# Patient Record
Sex: Male | Born: 1935 | Race: White | Hispanic: No | Marital: Married | Smoking: Never smoker
Health system: Southern US, Community
[De-identification: ages and names within clinical notes are randomized; demographics above are authoritative.]

## PROBLEM LIST (undated history)

## (undated) DIAGNOSIS — E119 Type 2 diabetes mellitus without complications: Secondary | ICD-10-CM

## (undated) DIAGNOSIS — G20A1 Parkinson's disease without dyskinesia, without mention of fluctuations: Secondary | ICD-10-CM

## (undated) DIAGNOSIS — F0280 Dementia in other diseases classified elsewhere without behavioral disturbance: Secondary | ICD-10-CM

## (undated) DIAGNOSIS — L570 Actinic keratosis: Secondary | ICD-10-CM

## (undated) HISTORY — DX: Actinic keratosis: L57.0

---

## 2004-12-17 ENCOUNTER — Ambulatory Visit: Payer: Self-pay | Admitting: Ophthalmology

## 2005-09-02 ENCOUNTER — Ambulatory Visit: Payer: Self-pay | Admitting: Ophthalmology

## 2005-09-09 ENCOUNTER — Ambulatory Visit: Payer: Self-pay | Admitting: Ophthalmology

## 2005-12-15 ENCOUNTER — Ambulatory Visit: Payer: Self-pay | Admitting: Gastroenterology

## 2006-09-22 ENCOUNTER — Ambulatory Visit: Payer: Self-pay | Admitting: Unknown Physician Specialty

## 2007-02-20 ENCOUNTER — Emergency Department: Payer: Self-pay | Admitting: Emergency Medicine

## 2007-09-22 DIAGNOSIS — C4492 Squamous cell carcinoma of skin, unspecified: Secondary | ICD-10-CM

## 2007-09-22 HISTORY — DX: Squamous cell carcinoma of skin, unspecified: C44.92

## 2009-02-13 ENCOUNTER — Ambulatory Visit: Payer: Self-pay | Admitting: Internal Medicine

## 2009-12-13 ENCOUNTER — Emergency Department: Payer: Self-pay | Admitting: Emergency Medicine

## 2011-12-04 ENCOUNTER — Emergency Department: Payer: Self-pay | Admitting: Internal Medicine

## 2011-12-04 LAB — COMPREHENSIVE METABOLIC PANEL
Albumin: 4 g/dL (ref 3.4–5.0)
Alkaline Phosphatase: 73 U/L (ref 50–136)
Anion Gap: 10 (ref 7–16)
Calcium, Total: 9.1 mg/dL (ref 8.5–10.1)
Creatinine: 0.95 mg/dL (ref 0.60–1.30)
EGFR (Non-African Amer.): >60
Glucose: 188 mg/dL — ABNORMAL HIGH (ref 65–99)
Sodium: 143 mmol/L (ref 136–145)
Total Protein: 7.4 g/dL (ref 6.4–8.2)

## 2011-12-04 LAB — CBC
HGB: 15.2 g/dL (ref 13.0–18.0)
MCV: 96 fL (ref 80–100)
Platelet: 148 10*3/uL — ABNORMAL LOW (ref 150–440)
RBC: 4.67 10*6/uL (ref 4.40–5.90)
WBC: 5.4 10*3/uL (ref 3.8–10.6)

## 2011-12-04 LAB — TROPONIN I: Troponin-I: <0.02 ng/mL

## 2011-12-30 ENCOUNTER — Ambulatory Visit: Payer: Self-pay | Admitting: Internal Medicine

## 2012-01-11 ENCOUNTER — Ambulatory Visit: Payer: Self-pay | Admitting: Internal Medicine

## 2012-03-07 ENCOUNTER — Ambulatory Visit: Payer: Self-pay | Admitting: Internal Medicine

## 2012-03-12 ENCOUNTER — Ambulatory Visit: Payer: Self-pay | Admitting: Internal Medicine

## 2012-04-11 ENCOUNTER — Ambulatory Visit: Payer: Self-pay | Admitting: Internal Medicine

## 2013-06-07 ENCOUNTER — Observation Stay: Payer: Self-pay | Admitting: Internal Medicine

## 2013-06-07 LAB — CBC
HCT: 42.2 % (ref 40.0–52.0)
HGB: 15.2 g/dL (ref 13.0–18.0)
MCV: 91 fL (ref 80–100)
RDW: 13.4 % (ref 11.5–14.5)
WBC: 9.4 10*3/uL (ref 3.8–10.6)

## 2013-06-07 LAB — COMPREHENSIVE METABOLIC PANEL
Albumin: 3.9 g/dL (ref 3.4–5.0)
Alkaline Phosphatase: 79 U/L (ref 50–136)
Anion Gap: 12 (ref 7–16)
Bilirubin,Total: 3.4 mg/dL — ABNORMAL HIGH (ref 0.2–1.0)
Chloride: 103 mmol/L (ref 98–107)
Co2: 21 mmol/L (ref 21–32)
Creatinine: 1.09 mg/dL (ref 0.60–1.30)
EGFR (African American): >60
Glucose: 126 mg/dL — ABNORMAL HIGH (ref 65–99)
Osmolality: 277 (ref 275–301)
SGOT(AST): 26 U/L (ref 15–37)
SGPT (ALT): 31 U/L (ref 12–78)

## 2013-06-07 LAB — LIPASE, BLOOD: Lipase: 227 U/L (ref 73–393)

## 2013-06-08 LAB — BASIC METABOLIC PANEL
BUN: 17 mg/dL (ref 7–18)
Chloride: 108 mmol/L — ABNORMAL HIGH (ref 98–107)
Creatinine: 0.81 mg/dL (ref 0.60–1.30)
EGFR (African American): >60
Glucose: 102 mg/dL — ABNORMAL HIGH (ref 65–99)
Osmolality: 279 (ref 275–301)
Potassium: 3.9 mmol/L (ref 3.5–5.1)

## 2013-06-08 LAB — CBC WITH DIFFERENTIAL/PLATELET
Basophil #: 0 10*3/uL (ref 0.0–0.1)
Basophil %: 0.1 %
Eosinophil #: 0 10*3/uL (ref 0.0–0.7)
HGB: 13.4 g/dL (ref 13.0–18.0)
Lymphocyte %: 17.8 %
MCH: 32 pg (ref 26.0–34.0)
MCHC: 35.6 g/dL (ref 32.0–36.0)
Monocyte #: 1.2 x10 3/mm — ABNORMAL HIGH (ref 0.2–1.0)
Neutrophil #: 6 10*3/uL (ref 1.4–6.5)
RDW: 13.7 % (ref 11.5–14.5)
WBC: 8.8 10*3/uL (ref 3.8–10.6)

## 2013-06-09 LAB — BASIC METABOLIC PANEL
Anion Gap: 7 (ref 7–16)
BUN: 15 mg/dL (ref 7–18)
Co2: 23 mmol/L (ref 21–32)
EGFR (Non-African Amer.): >60
Glucose: 86 mg/dL (ref 65–99)
Potassium: 3.5 mmol/L (ref 3.5–5.1)

## 2013-06-09 LAB — CBC WITH DIFFERENTIAL/PLATELET
Basophil #: 0 10*3/uL (ref 0.0–0.1)
Basophil %: 0.2 %
Eosinophil #: 0.1 10*3/uL (ref 0.0–0.7)
Eosinophil %: 0.7 %
HCT: 34.4 % — ABNORMAL LOW (ref 40.0–52.0)
HGB: 12.3 g/dL — ABNORMAL LOW (ref 13.0–18.0)
MCH: 32.5 pg (ref 26.0–34.0)
MCHC: 35.9 g/dL (ref 32.0–36.0)
Monocyte #: 1.2 x10 3/mm — ABNORMAL HIGH (ref 0.2–1.0)
Neutrophil #: 5 10*3/uL (ref 1.4–6.5)
Neutrophil %: 60.9 %
RDW: 13.2 % (ref 11.5–14.5)

## 2013-06-09 LAB — BILIRUBIN, TOTAL: Bilirubin,Total: 2.8 mg/dL — ABNORMAL HIGH (ref 0.2–1.0)

## 2013-06-09 LAB — BILIRUBIN, DIRECT: Bilirubin, Direct: 0.3 mg/dL — ABNORMAL HIGH (ref 0.00–0.20)

## 2013-06-13 LAB — PATHOLOGY REPORT

## 2013-09-18 ENCOUNTER — Ambulatory Visit: Payer: Self-pay | Admitting: Unknown Physician Specialty

## 2013-12-13 DIAGNOSIS — C4491 Basal cell carcinoma of skin, unspecified: Secondary | ICD-10-CM

## 2013-12-13 HISTORY — DX: Basal cell carcinoma of skin, unspecified: C44.91

## 2015-02-01 NOTE — H&P (Signed)
PATIENT NAME:  Brandon, Barrett MR#:  [id number] DATE OF BIRTH:  November 05, 1935  DATE OF ADMISSION:  06/07/2013  REFERRING PHYSICIAN:  Dr. Benjaman Lobe.   PRIMARY CARE PHYSICIAN: Dr. Ginette Pitman.  CHIEF COMPLAINT: Hiccups, p.o. intolerance. Came in with complaints of shortness of breath.   HISTORY OF PRESENT ILLNESS: The patient is a pleasant 79 year old Caucasian male with history of hyperlipidemia, hypertension, who recently was getting treated by Dr. Tami Ribas as an outpatient for right-sided neck infection, which he states is severe. He initially had throat pain and had gone to urgent care center where he was given amoxicillin, but he did not get better and went to Dr. Tami Ribas apparently who has seen him 3 times. He was placed on prednisone and another antibiotic, which he thinks is clindamycin for 2 weeks. He last took it on Wednesday. Since then, he has had increased drainage from the back of his throat and he has constant nausea. He has no fevers. He constantly feels like he is going to vomit. He has poor p.o. intake and the last couple of days has  had intractable hiccups.  He feels short of breath after the hiccups as he feels like he cannot get enough air. He came into the hospital where a CAT scan of the neck with contrast was done which shows the no acute abnormality of the nasopharynx or oropharynx. No parapharyngeal or retropharyngeal abscess is demonstrated, but there is some mild thickening of the upper thoracic esophagus. He has lost 10 pounds since this whole ordeal and hospitalist services were contacted for further evaluation and management.   PAST MEDICAL HISTORY: Hyperlipidemia, hypertension, diabetes, recent throat infection.   PAST SURGICAL HISTORY:  History of perianal abscess drainage, deviated septum surgery, cataract surgery. He has had skin lesions removed which were cancerous. 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   SOCIAL HISTORY: No tobacco.  Occasional alcohol. No illicit drugs. He lives with his wife.   ALLERGIES: No known drug allergies.   OUTPATIENT MEDICATIONS: Aspirin 325 mg daily, Lipitor 40 mg daily, lisinopril 5 mg daily, metformin 500 mg daily.   REVIEW OF SYSTEMS: CONSTITUTIONAL: Positive for 10 pounds weight loss in the last few weeks, weakness. No fevers.  EYES: No blurry vision or double vision.  ENT: Has hearing loss and he has a hearing aid. He has some postnasal drip and has pooling of secretions, in his mouth. He has intractable hiccups for the last couple of days.  RESPIRATORY: No cough, no dyspnea on exertion. No COPD.  No wheezing. No painful respirations. Feels short of breath.  CARDIOVASCULAR: No chest pain, orthopnea or swelling in the legs. No arrhythmia.  GASTROINTESTINAL: Positive for nausea hiccups. No abdominal pain. No hematemesis, bloody stools or dark stools.  GENITOURINARY: Denies dysuria or hematuria.  HEMATOLOGIC AND LYMPHATIC: No anemia or easy bruising.  SKIN: No rashes.  MUSCULOSKELETAL: Denies arthritis or gout.  NEUROLOGIC: Denies focal weakness or numbness.  PSYCHIATRIC: Denies anxiety or insomnia.   PHYSICAL EXAMINATION: VITAL SIGNS: Temperature on arrival was noted to be 97.9, pulse rate was 91, respiratory rate initially was 28, last respiratory rate of 20. Initial blood pressure was 117/68. O2 sat initially was 87% on room air. He is currently on oxygen satting 100%.  GENERAL: The patient is Caucasian male lying in bed in no obvious distress, but with intractable hiccups. He is hiccuping multiple times as I am examining him.  HEENT: Normocephalic, atraumatic. Pupils are equal  and reactive. Anicteric sclerae. Extraocular muscles intact. Moist mucous membranes. There no tonsillar exudate or enlargement on physical examination.   NECK: Supple. No thyroid tenderness. No cervical lymphadenopathy.  CARDIOVASCULAR: S1, S2 regular rate and rhythm. No murmurs or rubs or gallops.  LUNGS: Clear  to auscultation. No wheezing, rhonchi or rales.  ABDOMEN: Soft, nontender, nondistended. Positive bowel sounds in all quadrants. No guarding.  EXTREMITIES: No significant lower extremity edema.  NEUROLOGIC: Cranial nerves II through XII grossly intact. Strength is 5 out of 5 in all extremities. Sensation intact to light touch.  PSYCHIATRIC: Awake, alert, oriented x3.   LABORATORY AND IMAGING:  CAT scan of neck with contrast as above. Furthermore, there is no evidence of cord or cervical lymphadenopathy. The pulmonary apices are clear. WBC 9.4, hemoglobin 15.2. Troponin is negative, platelets are 173. LFTs showed bilirubin of 3.4, otherwise within normal limits. BUN 23, creatinine is 1.09, glucose 126, sodium 136, potassium 3.6. EKG showed sinus rhythm with premature atrial complex, rate is 71. Nonspecific ST changes or.   ASSESSMENT AND PLAN: We have a 79 year old male with hypertension, diabetes,  hyperlipidemia with recent infection who finished prednisone and clindamycin a few days ago, presents with poor p.o. intake secondary to pooling of secretions in the back of his throat,  persistent nausea and the last couple of days hiccups. He has lost 10 pounds and has poor p.o. intake and p.o. intolerance. At this point, we will admit him to go to the hospital for observation. The etiology of this is unclear. The CAT scan of the neck shows no significant abscess or lymphadenopathy. It does show esophageal thickening in the thoracic region. The source of this is unclear. I have paged Dr. Tami Ribas to discuss the recent infection with him, but I doubt that he has acute infection. He has no fever or leukocytosis. We will obtain a Gastroenterology consult, start him on Zofran and Thorazine for symptomatic relief and some gentle fluids. Would start him on clears for now and see what Gastroenterology has to say. He might need an upper endoscopy for further evaluation. Although he has tachypnea initially and hypoxia, I  doubt he has acute respiratory failure. He has persistent hiccups and post hiccups he becomes, kind of tachypneic as he has to catch his breath. Nonetheless, I would further investigate this with x-ray of the chest and an ABG. He does not appear to be short of breath to me nor does he appear to be labored. I would hold his metformin, start him on sliding scale insulin, hold his blood pressure medications, start him on some gentle fluids and start him on deep vein thrombosis prophylaxis as well. He has received a dose of the Decadron as well as low-dose Ativan. We will see if the Thorazine p.o. will help his hiccups. I will see what the x-ray of the chest shows as well.   Total Time Spent: 55 minutes.   CODE STATUS: THE PATIENT IS FULL CODE.     ____________________________ Vivien Presto, MD sa:dp D: 06/07/2013 13:36:06 ET T: 06/07/2013 14:14:35 ET JOB#: [id number]  cc: Vivien Presto, MD, <Dictator> Tracie Harrier, MD Vivien Presto MD ELECTRONICALLY SIGNED 07/04/2013 13:47

## 2015-02-01 NOTE — Consult Note (Signed)
Details:   - EGD done today for wt loss, hiccups, anorexia.   Essentially unremarkable except for what appears to be atrophic gastritis.   Biospies taken for h.pylori. Esophagus also biopsied at GE junction and prox esophagus.   No etiology seen for the symptoms.  Would obtain CT c/a/p given the ongoing symtpoms with no clear etiology.   Electronic Signatures: Arther Dames (MD)  (Signed 28-Aug-14 15:51)  Authored: Details   Last Updated: 28-Aug-14 15:51 by Arther Dames (MD)

## 2015-02-01 NOTE — Op Note (Signed)
PATIENT NAME:  Brandon Barrett, Brandon Barrett MR#:  [id number] DATE OF BIRTH:  March 25, 1936  DATE OF PROCEDURE:  06/07/2013  PREOPERATIVE DIAGNOSIS: Dysphagia, difficulty breathing.   POSTOPERATIVE DIAGNOSIS: Dysphagia, difficulty breathing.   PROCEDURE: Flexible fiber-optic nasal laryngoscopy.   SURGEON: Malon Kindle, MD.   INDICATIONS: The patient is with complaints of dysphagia and some shortness of breath this morning. He is not having any significant hoarseness and there is certainly no stridor.   FINDINGS: Hypopharynx, larynx and tongue base were carefully inspected. Vocal cords are clear and mobile. There may be some mild pachyderma of the posterior glottis but no significant swelling or erythema is seen. There is no pooling of secretions or any mass lesions in the hypopharynx. No mucosal abnormalities are seen other than the minor pachyderma. There is certainly no evidence of infection such as thrush or bacterial infection.   DESCRIPTION OF PROCEDURE: After discussing the procedure with the patient, the left nasal cavity was assessed endoscopically with a flexible fiber-optic scope. The scope was passed into the nasopharynx and down to the region of the hypopharynx, larynx and tongue base. These areas were carefully inspected. He was instructed to phonate to assess vocal cord motion which was normal. Tongue was extended to further evaluate the tongue base. Findings are as noted above. The scope was withdrawn. The patient tolerated the procedure well.    ____________________________ Sammuel Hines. Richardson Landry, MD psb:np D: 06/07/2013 13:51:59 ET T: 06/07/2013 18:25:15 ET JOB#: [id number]  cc: Sammuel Hines. Richardson Landry, MD, <Dictator> Riley Nearing MD ELECTRONICALLY SIGNED 06/14/2013 17:33

## 2015-02-01 NOTE — Consult Note (Signed)
PATIENT NAME:  Brandon Barrett, Brandon Barrett MR#:  [id number] DATE OF BIRTH:  04-Jan-1936  DATE OF CONSULTATION:  06/07/2013  REQUESTING PHYSICIAN: Ferman Hamming, MD.   CONSULTING PHYSICIAN: Sammuel Hines. Richardson Landry, M.D.   REASON FOR CONSULTATION: Swallowing and breathing difficulty.   HISTORY OF PRESENT ILLNESS: This is a 79 year old male who has been seen by Dr. Tami Ribas recently with stomatitis which significantly improved with antibiotics and prednisone. He had some hoarseness initially but that has all gotten better. Dr. Tami Ribas, just saw him 2 days ago and had referred him into Gastroenterology because of persistent issues with getting nauseated. He has had some anorexia with this and says his food just does not taste good. He is mostly drinking liquids. He gets nauseated if he tries to eat. He also has a cough that aggravates the nausea. He was seen by Gastroenterology yesterday and apparently they set up an upper GI endoscopy for next week. Today he felt like he was short of breath with a lot of phlegm in his throat and some increasing difficulty swallowing. Because of the shortness of breath. He came to the Emergency Room. He feels like he may have some mucus drainage down the back of his throat contributing to some of this. He feels like the right side of the throat is worse. He is not running any fever. He had difficulty sleeping last night due to the feeling of having problems breathing and difficulty swallowing.   PAST MEDICAL HISTORY: Diabetes, hypertension, hyperlipidemia.   ALLERGIES: None.   MEDICATIONS: Metformin 500 mg daily, lisinopril 5 mg p.o. daily, Lipitor 40 mg p.o. daily. Aspirin 325 mg daily.   SOCIAL HISTORY: The patient is a nonsmoker and does not drink alcohol.   REVIEW OF SYSTEMS: As above.   PHYSICAL EXAMINATION: VITAL SIGNS: Temperature 97.9, pulse 69 blood pressure 131/75.  GENERAL: Well-developed, well-nourished male in no acute distress. There is no evidence of stridor. He  has no appreciable hoarseness.  HEAD AND FACE: Head is normocephalic, atraumatic. No facial skin lesions. Facial strength is normal and symmetric.  EARS:  He wears hearing aids. External ears are unremarkable. Ear canals: Tympanic membranes are clear bilaterally.  NOSE: External nose unremarkable. Nasal cavity is clear. No purulence or polyps are seen. There is some minimal rightward septal deviation.  ORAL CAVITY AND OROPHARYNX: Teeth, lips, and gums unremarkable. Tongue and floor of mouth without lesions. Posterior pharynx is clear with no erythema or exudate. There is no evidence of any ulceration or frank thrush.  NECK: Neck is supple without adenopathy or mass. There is no thyromegaly. Salivary glands are soft and nontender without masses.  NEUROLOGIC EXAM: Cranial nerves II through XII are grossly intact.   Flexible fiber-optic nasal laryngoscopy was performed today because of shortness of breath and dysphagia. The hypopharynx, larynx and tongue base are essentially unremarkable. There may be some mild pachyderma of the posterior glottis seen with laryngopharyngeal reflux but nothing impressive. There is no significant erythema there. There is no pooling of secretions and certainly no evidence of any infection.   DATA REVIEW: His white count is normal at 9.4. Total bilirubin was slightly elevated and his BUN is slightly elevated, has a normal creatinine. A CT scan of the neck was performed and there was some mild thickening seen in the wall of the upper thoracic esophagus in areas there that were visualized. No cervical adenopathy was seen.   ASSESSMENT:  Mr. Wehmeyer has no evidence of any compromise of his upper airway. In addition  the hypopharynx, larynx and tongue base are essentially unremarkable except for some suggestion of mild reflux. Certainly nothing that would impair his swallowing significantly. The CT scan, however, indicated possible esophageal pathology.   PLAN: I would defer to  GI at this point for further evaluation for possible esophageal pathology contributing to this. Obviously initiation of reflux management might be a temporizing measure if upper GI endoscopy could not be performed until next week, so Prilosec 40 mg prior to his evening meal was suggested. Tessalon Perles may be considered to help suppress some of his cough reflex. We will defer to the Emergency Room to do any further evaluation deemed necessary to assess his shortness of breath although his sats were fine in the Emergency Room.     ____________________________ Sammuel Hines. Richardson Landry, MD psb:dp D: 06/07/2013 13:50:25 ET T: 06/07/2013 14:42:29 ET JOB#: [id number]  cc: Sammuel Hines. Richardson Landry, MD, <Dictator> Riley Nearing MD ELECTRONICALLY SIGNED 06/14/2013 17:33

## 2015-02-01 NOTE — Discharge Summary (Signed)
PATIENT NAME:  Brandon Barrett, Brandon Barrett MR#:  [id number] DATE OF BIRTH:  [birth year]  DATE OF ADMISSION:  06/07/2013 DATE OF DISCHARGE:  06/10/2013  DISCHARGE DIAGNOSIS: Intractable hiccups associated with nausea and difficulty  swallowing with shortness of breath.   CHIEF COMPLAINT: Hiccups, shortness of breath, difficulty swallowing.  HISTORY OF PRESENT ILLNESS:  Alford Gamero is a 79 year old gentleman with a history of hyperlipidemia, hypertension and type 2 diabetes who was recently treated for throat infection, was recently also seen by ENT and placed on prednisone and a second antibiotic, who presented to the ER complaining of increased drainage in the back of the throat associated with nausea. He did complain of pain in the back of her throat and right side of the neck associated with decreased p.o. intake, weight loss and intractable hiccups.   PAST SURGICAL HISTORY: Significant for perianal abscess drainage, deviated septum surgery and cataract surgery.   PAST MEDICAL HISTORY: Significant for hyperlipidemia, hypertension, type 2 diabetes and recent throat infection.   PHYSICAL EXAMINATION: VITAL SIGNS: Temperature was 97.9, pulse 91, respirations 28, blood pressure 117/78, O2 sat 87% on room air and with oxygen improved to 100%. HEENT: [place]/AT  NECK: Supple.  HEART: S1 and S2.  LUNGS: Clear to auscultation.  ABDOMEN: Soft and nontender.  EXTREMITIES: No edema.  NEUROLOGIC: Nonfocal.  LABORATORY AND DIAGNOSTICS: WBC 9.4, hemoglobin 15.2. Troponin was negative. Platelets 173. LFTs showed bilirubin of 3.4, BUN 23, creatinine 1.09, glucose 126, sodium 136, potassium 3.6.   EKG showed sinus rhythm with PACs.   CT scan of the neck with contrast did not show any acute abnormality of the nasopharynx or oropharynx. There was mild thickening of the wall of the upper thoracic esophagus. Trachea appeared to be normal. Pulmonary apices appeared to be normal.   CT of the chest and abdomen did  not show any acute pathology.   HOSPITAL COURSE: The patient was also seen by ENT specialist, Dr. Richardson Landry, and also seen by GI specialist, Dr. Rayann Heman, and underwent an EGD as well as a flexible fiber-optic nasal laryngoscopy. The hypopharynx, larynx and tongue base was carefully inspected. Vocal cords were clear and mobile. There was mild pachyderma of the posterior glottis but no significant swelling or erythema was seen. There was no pooling of secretions or any mass lesions in the hypopharynx. No evidence of thrush or bacterial infection. Upper GI endoscopy showed atrophic gastritis. This was biopsied. There was normal duodenum. The esophagus was normal. There was diffuse moderate inflammation characterized by granularity and atrophy in the gastric body. Biopsies were taken. The patient gradually improved from a symptom standpoint. Hiccups also resolved. His blood pressure medication was held, and he was discharged in stable condition on aspirin 325 mg a day, Lipitor 40 mg a day and chlorpromazine 25 mg t.i.d. p.r.n. The patient has been advised to follow with me, Dr. Ginette Pitman, in 1 to 2 weeks time and to call back if there are any questions or concerns.   TOTAL TIME SPENT IN DISCHARGING THE PATIENT: 30 minutes. ____________________________ Tracie Harrier, MD vh:sb D: 06/16/2013 13:39:42 ET T: 06/16/2013 15:18:41 ET JOB#: [id number]  cc: Tracie Harrier, MD, <Dictator> Tracie Harrier MD ELECTRONICALLY SIGNED 06/29/2013 18:26

## 2015-02-01 NOTE — Consult Note (Signed)
PATIENT NAME:  Brandon Barrett, Brandon Barrett MR#:  [id number] DATE OF BIRTH:  1935-10-25  DATE OF CONSULTATION:  06/07/2013  REFERRING PHYSICIAN:  Vivien Presto, MD CONSULTING PHYSICIAN:  Arther Dames, MD  REASON FOR CONSULT:  Nausea, hiccups.   HISTORY OF THE PRESENT ILLNESS: Brandon Barrett is a 79 year old male with a history notable for hypertension, hyperlipidemia, diabetes, who presented to the Emergency Room for evaluation of nausea, weight loss, hiccups. Brandon Barrett  has recently had what sounds to be a throat infection and this had received multiple courses of antibiotics. He has had resolution of his of his sore throat. However, he also has been struggling over approximately the past 2 to 3 weeks with nausea and anorexia. He has also lost 10 pounds over this 2- week span. The nausea is pretty much constant. He is able to tolerate some liquids but does develop severe nausea with eating any  solid foods.   Of note, when he presented to the Emergency Room, there was also some question of shortness of breath. He had a ENT fiber-optic exam which was essentially unremarkable. He also had a CT of his neck which showed some possible thickening of the esophagus.   To me, Mr. Harn denied any trouble with swallowing. This includes oral pharyngeal swallowing. In addition, he has not had any trouble with frank dysphagia or odynophagia. To me he endorsed that his main problems were hiccups and nausea. It appears that he has told other practitioners that he has had issues with drainage in the back of his throat. He does also report that he is frequently spitting what looks like saliva into a bucket. However, he is able to drink liquids without having to regurgitate them.   He has never had any previous history of nausea, dysphagia. He does report an upper endoscopy approximately 10 years ago for what he thinks was an ulcer. He is not having any rectal bleeding or melena.   PAST MEDICAL HISTORY:   1.   Hyperlipidemia.  2.  Hypertension.  3.  Diabetes.   PAST SURGICAL HISTORY: Perianal abscess drainage, deviated septum, cataract surgery.  SOCIAL HISTORY: He denies any tobacco, alcohol or recreational drugs.   FAMILY HISTORY: No family history of esophageal cancer or other GI malignancies.   ALLERGIES: NKDA.   OUTPATIENT MEDICATIONS: Aspirin 325 daily, Lipitor 40 daily, lisinopril 5 mg daily, metformin 500 mg daily.   REVIEW OF SYSTEMS:   CONSTITUTIONAL: A 10-pound weight loss in the past 2 weeks.  HEENT: Positive for hearing loss. GASTROINTESTINAL: See HPI.  HEMOLYMPHATIC: No easy bruising or bleeding. CARDIOVASCULAR: No chest pain or dyspnea on exertion. GENITOURINARY: No hematuria. INTEGUMENTARY: No rashes or pruritus PSYCHIATRIC: No depression/anxiety.  ENDOCRINE: No heat/cold intolerance, no hair loss or skin changes. ALLERGIC AND IMMUNOLOGIC: Negative for hives. RESPIRATORY: No cough.  Positive for shortness of breath.  MUSCULOSKELETAL: No joint swelling or muscle pain.  PHYSICAL EXAM:  VITAL SIGNS: Temperature 97.9, heart rate 91. Respiratory rate is 16. Blood pressure 117/68.  GENERAL: Elderly appearing male appears stated age. HEENT: Normocephalic/atraumatic. Extraocular movements are intact. Anicteric. NECK: Soft, supple. JVP appears normal. No adenopathy. CHEST: Clear to auscultation. No wheeze or crackle. Respirations unlabored. HEART: Regular. No murmur, rub, or gallop.  Normal S1 and S2. ABDOMEN: Soft, nontender, nondistended.  Normal active bowel sounds in all four quadrants.  No organomegaly. No masses EXTREMITIES: No swelling, well perfused. SKIN: No rash or lesion. Skin color, texture, turgor normal. NEUROLOGICAL: Grossly intact. PSYCHIATRIC: Normal tone and affect.  MUSCULOSKELETAL: No joint swelling or erythema.   DIAGNOSTIC STUDIES: BUN 23, creatinine 1.09, sodium 136, potassium 3.6, chloride 103, bicarb 21. Lipase is 227. Albumin 3.9, T bili 3.4. Alk  phos is 79. AST 26, ALT 31. Hemoglobin is 15. Hematocrit is 42. White count is 9.4. Platelets are 173.   CT of the neck with contrast shows mild thickening of the wall of the upper thoracic esophagus. That could reflect incomplete distention; otherwise, normal CT of the neck.   ASSESSMENT AND PLAN:   1.  Nausea, hiccups. It is unclear what the cause of the nausea and hiccups is related to. It is possible that inflammation of the esophagus could cause diaphragmatic inflammation which could cause the hiccups and the nausea. This also seems like a possibility given the thickening on the CT scan. However, thickening of the esophagus can quite often be nonspecific. We will plan to perform an upper endoscopy to investigate this esophagus tomorrow. Please keep the patient n.p.o. after midnight. He can stay on clear liquids for now. Please refrain from anticoagulation in the morning.   In addition, agree with symptomatic management with Zofran for the nausea. Thorazine is also reasonable for the hiccups. Could try baclofen if not good results with the Thorazine.   2.  Elevated total bilirubin. In the a.m. please repeat the bilirubin with fractionation. Also, would obtain a right upper quadrant ultrasound with Dopplers to investigate this isolated elevated total bilirubin. I suspect this is likely a benign finding.   Further recommendations will be pending the findings on  upper endoscopy. Also agree with starting Protonix 40 mg b.i.d. in case this is esophagitis or dyspepsia.   Thank you for this consult.    ____________________________ Arther Dames, MD mr:np D: 06/07/2013 18:19:03 ET T: 06/07/2013 18:55:29 ET JOB#: [id number]  cc: Arther Dames, MD, <Dictator> Mellody Life MD ELECTRONICALLY SIGNED 06/15/2013 17:11

## 2016-01-29 ENCOUNTER — Other Ambulatory Visit: Payer: Self-pay | Admitting: Internal Medicine

## 2016-01-29 DIAGNOSIS — R55 Syncope and collapse: Secondary | ICD-10-CM

## 2016-01-29 DIAGNOSIS — M542 Cervicalgia: Secondary | ICD-10-CM

## 2016-01-29 DIAGNOSIS — R519 Headache, unspecified: Secondary | ICD-10-CM

## 2016-01-29 DIAGNOSIS — R51 Headache: Secondary | ICD-10-CM

## 2016-02-04 ENCOUNTER — Ambulatory Visit
Admission: RE | Admit: 2016-02-04 | Discharge: 2016-02-04 | Disposition: A | Discharge status: Home / Self Care | Payer: Medicare Other | Source: Ambulatory Visit | Attending: Internal Medicine | Admitting: Internal Medicine

## 2016-02-04 DIAGNOSIS — R51 Headache: Secondary | ICD-10-CM | POA: Diagnosis present

## 2016-02-04 DIAGNOSIS — R55 Syncope and collapse: Secondary | ICD-10-CM

## 2016-02-04 DIAGNOSIS — R519 Headache, unspecified: Secondary | ICD-10-CM

## 2016-02-04 DIAGNOSIS — M542 Cervicalgia: Secondary | ICD-10-CM | POA: Diagnosis not present

## 2016-02-04 LAB — POCT I-STAT CREATININE: Creatinine, Ser: 0.9 mg/dL (ref 0.61–1.24)

## 2016-02-04 MED ORDER — GADOBENATE DIMEGLUMINE 529 MG/ML IV SOLN
15.0000 mL | Freq: Once | INTRAVENOUS | Status: AC | PRN
  Administered 2016-02-04: 15 mL via INTRAVENOUS

## 2019-03-20 ENCOUNTER — Other Ambulatory Visit: Payer: Self-pay | Admitting: Neurology

## 2019-03-20 DIAGNOSIS — R55 Syncope and collapse: Secondary | ICD-10-CM

## 2019-04-04 ENCOUNTER — Ambulatory Visit: Payer: Medicare Other

## 2019-04-12 ENCOUNTER — Ambulatory Visit
Admission: RE | Admit: 2019-04-12 | Discharge: 2019-04-12 | Disposition: A | Discharge status: Home / Self Care | Payer: Medicare HMO | Source: Ambulatory Visit | Attending: Neurology | Admitting: Neurology

## 2019-04-12 ENCOUNTER — Other Ambulatory Visit: Payer: Self-pay

## 2019-04-12 DIAGNOSIS — R55 Syncope and collapse: Secondary | ICD-10-CM | POA: Diagnosis present

## 2019-04-12 LAB — POCT I-STAT CREATININE: Creatinine, Ser: 1 mg/dL (ref 0.61–1.24)

## 2019-04-12 MED ORDER — GADOBUTROL 1 MMOL/ML IV SOLN
9.0000 mL | Freq: Once | INTRAVENOUS | Status: AC | PRN
  Administered 2019-04-12: 9 mL via INTRAVENOUS

## 2020-05-20 ENCOUNTER — Ambulatory Visit: Payer: Medicare HMO | Admitting: Dermatology

## 2020-10-28 ENCOUNTER — Ambulatory Visit: Payer: Medicare HMO | Admitting: Dermatology

## 2020-12-05 ENCOUNTER — Ambulatory Visit: Payer: Medicare HMO | Admitting: Dermatology

## 2021-02-05 ENCOUNTER — Other Ambulatory Visit: Payer: Self-pay

## 2021-02-05 ENCOUNTER — Ambulatory Visit: Payer: Medicare HMO | Admitting: Dermatology

## 2021-02-05 DIAGNOSIS — L820 Inflamed seborrheic keratosis: Secondary | ICD-10-CM | POA: Diagnosis not present

## 2021-02-05 DIAGNOSIS — L814 Other melanin hyperpigmentation: Secondary | ICD-10-CM | POA: Diagnosis not present

## 2021-02-05 DIAGNOSIS — W57XXXA Bitten or stung by nonvenomous insect and other nonvenomous arthropods, initial encounter: Secondary | ICD-10-CM | POA: Diagnosis not present

## 2021-02-05 DIAGNOSIS — L570 Actinic keratosis: Secondary | ICD-10-CM

## 2021-02-05 DIAGNOSIS — Z85828 Personal history of other malignant neoplasm of skin: Secondary | ICD-10-CM | POA: Diagnosis not present

## 2021-02-05 DIAGNOSIS — L578 Other skin changes due to chronic exposure to nonionizing radiation: Secondary | ICD-10-CM

## 2021-02-05 DIAGNOSIS — Z1283 Encounter for screening for malignant neoplasm of skin: Secondary | ICD-10-CM

## 2021-02-05 DIAGNOSIS — D229 Melanocytic nevi, unspecified: Secondary | ICD-10-CM

## 2021-02-05 DIAGNOSIS — D1800 Hemangioma unspecified site: Secondary | ICD-10-CM

## 2021-02-05 DIAGNOSIS — L821 Other seborrheic keratosis: Secondary | ICD-10-CM

## 2021-02-05 NOTE — Patient Instructions (Signed)

## 2021-02-05 NOTE — Progress Notes (Signed)
Follow-Up Visit   Subjective  Brandon Barrett is a 85 y.o. male who presents for the following: Annual Exam (History of SCC and BCC - UBSE today). The patient presents for Upper Body Skin Exam (UBSE) for skin cancer screening and mole check.  Accompanied by wife who contributes to history.  The following portions of the chart were reviewed this encounter and updated as appropriate:   Allergies  Meds  Problems  Med Hx  Surg Hx  Fam Hx     Review of Systems:  No other skin or systemic complaints except as noted in HPI or Assessment and Plan.  Objective  Well appearing patient in no apparent distress; mood and affect are within normal limits.  All skin waist up examined.  Objective  Trunk: Pink paules  Objective  Scalp, face (21): Erythematous thin papules/macules with gritty scale.   Objective  Scalp x 1, sacral area x 1 (2): Erythematous keratotic or waxy stuck-on papule or plaque.    Assessment & Plan    Lentigines - Scattered tan macules - Due to sun exposure - Benign-appering, observe - Recommend daily broad spectrum sunscreen SPF 30+ to sun-exposed areas, reapply every 2 hours as needed. - Call for any changes  Seborrheic Keratoses - Stuck-on, waxy, tan-brown papules and/or plaques  - Benign-appearing - Discussed benign etiology and prognosis. - Observe - Call for any changes  Melanocytic Nevi - Tan-brown and/or pink-flesh-colored symmetric macules and papules - Benign appearing on exam today - Observation - Call clinic for new or changing moles - Recommend daily use of broad spectrum spf 30+ sunscreen to sun-exposed areas.   Hemangiomas - Red papules - Discussed benign nature - Observe - Call for any changes  Actinic Damage - Chronic condition, secondary to cumulative UV/sun exposure - diffuse scaly erythematous macules with underlying dyspigmentation - Recommend daily broad spectrum sunscreen SPF 30+ to sun-exposed areas, reapply every  2 hours as needed.  - Staying in the shade or wearing long sleeves, sun glasses (UVA+UVB protection) and wide brim hats (4-inch brim around the entire circumference of the hat) are also recommended for sun protection.  - Call for new or changing lesions.  Skin cancer screening performed today.  Insect bite of multiple sites with local reaction Trunk  Benign, observe.    AK (actinic keratosis) (21) Scalp, face  Destruction of lesion - Scalp, face Complexity: simple   Destruction method: cryotherapy   Informed consent: discussed and consent obtained   Timeout:  patient name, date of birth, surgical site, and procedure verified Lesion destroyed using liquid nitrogen: Yes   Region frozen until ice ball extended beyond lesion: Yes   Outcome: patient tolerated procedure well with no complications   Post-procedure details: wound care instructions given    Inflamed seborrheic keratosis (2) Scalp x 1, sacral area x 1  Destruction of lesion - Scalp x 1, sacral area x 1 Complexity: simple   Destruction method: cryotherapy   Informed consent: discussed and consent obtained   Timeout:  patient name, date of birth, surgical site, and procedure verified Lesion destroyed using liquid nitrogen: Yes   Region frozen until ice ball extended beyond lesion: Yes   Outcome: patient tolerated procedure well with no complications   Post-procedure details: wound care instructions given    Skin cancer screening  Return in about 6 months (around 08/07/2021) for AK follow up.  I, Ashok Cordia, CMA, am acting as scribe for Sarina Ser, MD .   Documentation: I have  reviewed the above documentation for accuracy and completeness, and I agree with the above.  Sarina Ser, MD

## 2021-02-09 ENCOUNTER — Encounter: Payer: Self-pay | Admitting: Dermatology

## 2021-03-27 IMAGING — MR MRI HEAD WITHOUT AND WITH CONTRAST
15 series · 48 of 48 positions shown · IV contrast (agent unspecified)
Comparison: MRI head 02/04/2016

CLINICAL DATA: Syncope

EXAM:
MRI HEAD WITHOUT AND WITH CONTRAST
TECHNIQUE: Multiplanar, multiecho pulse sequences of the brain and surrounding
structures were obtained without and with intravenous contrast.
CONTRAST:  9 mL Gadovist IV

[Series 5: ax dwi_tracew · axial · 3.0mm · 0.60mm/px · z∈[-125,+36]mm · 4 of 55 slices shown]
[im 1/55]
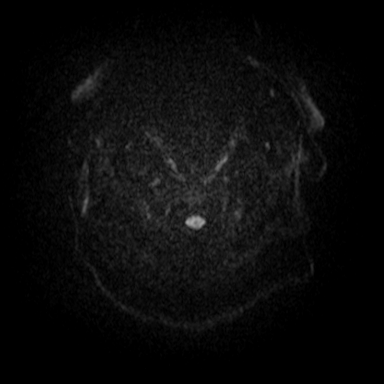
[im 19/55]
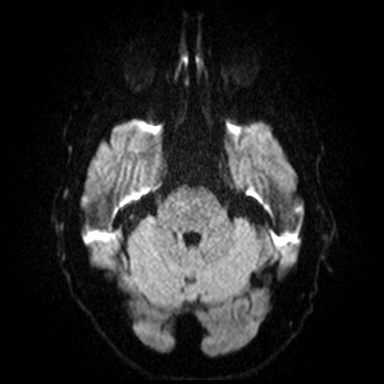
[im 37/55]
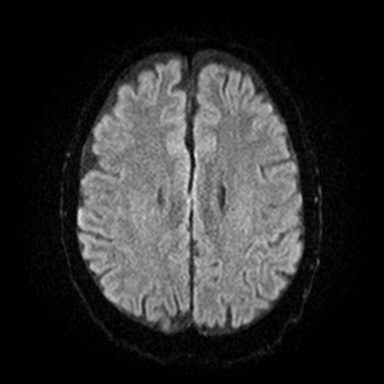
[im 55/55]
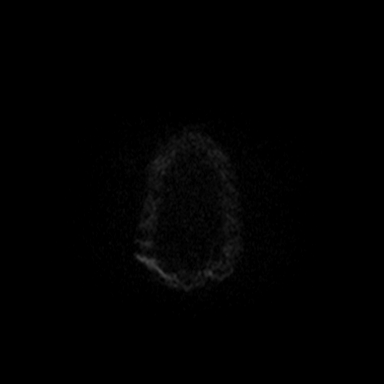

[Series 6: ax dwi_adc · axial · 3.0mm · 0.60mm/px · z∈[-125,+36]mm · 3 of 50 slices shown]
[im 1/50]
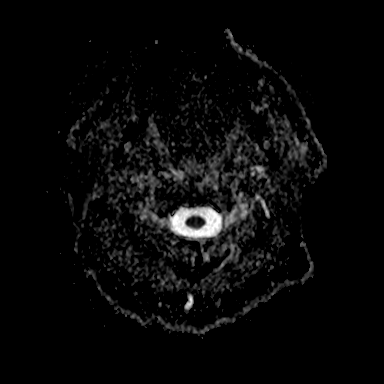
[im 25/50]
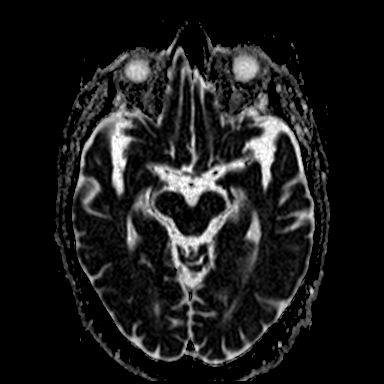
[im 50/50]
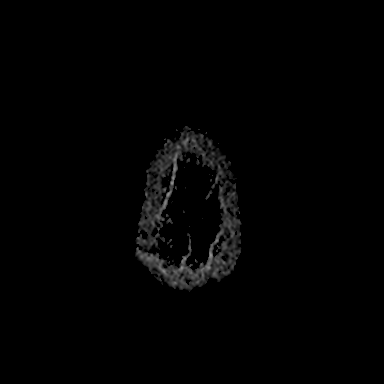

[Series 7: cor dwi_tracew · coronal · 5.0mm · 0.60mm/px · 2 of 45 slices shown]
[im 1/45]
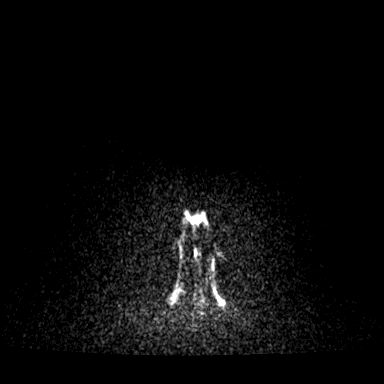
[im 45/45]
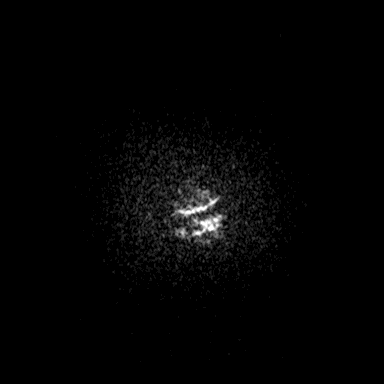

[Series 8: cor dwi_adc · coronal · 5.0mm · 0.60mm/px · 2 of 45 slices shown]
[im 1/45]
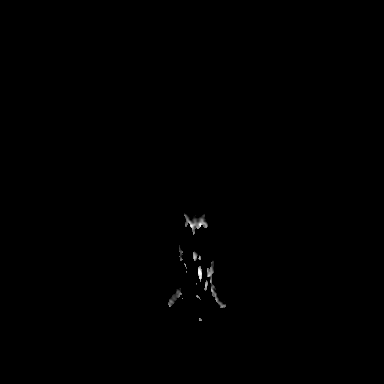
[im 45/45]
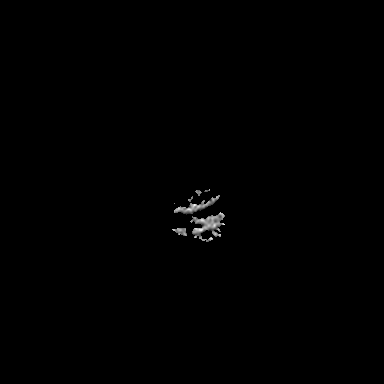

[Series 9: T1 · sagittal · 5.0mm · 0.62mm/px · 1 of 25 slices shown (1 of 2)]
[im 1/25]
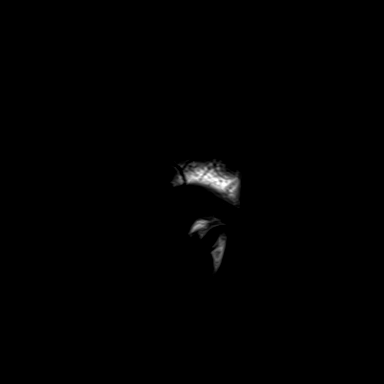

[Series 10: T2 · axial · 5.0mm · 0.53mm/px · 1 of 27 slices shown]
[im 1/27]
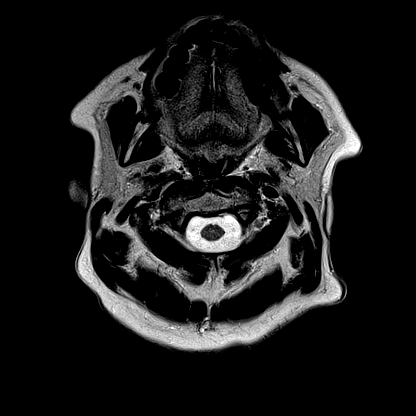

[Series 11: mag_images · axial · 3.0mm · 0.90mm/px · z∈[-134,+42]mm · 3 of 60 slices shown]
[im 1/60]
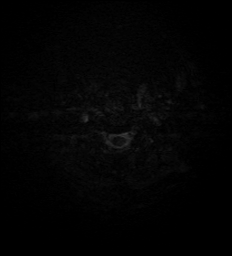
[im 30/60]
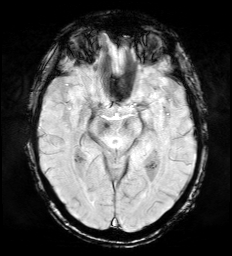
[im 60/60]
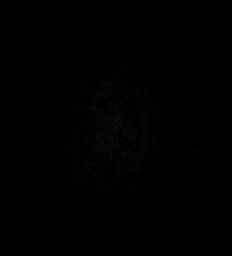

[Series 12: pha_images · axial · 3.0mm · 0.90mm/px · z∈[-131,+39]mm · 3 of 58 slices shown]
[im 1/58]
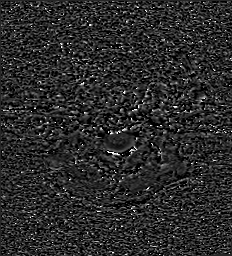
[im 29/58]
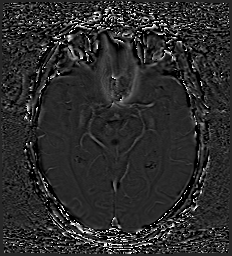
[im 58/58]
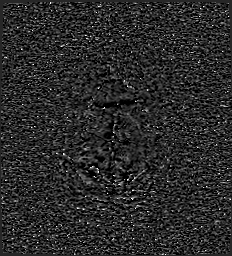

[Series 13: swi_images · axial · 3.0mm · 0.90mm/px · z∈[-134,+42]mm · 3 of 60 slices shown]
[im 1/60]
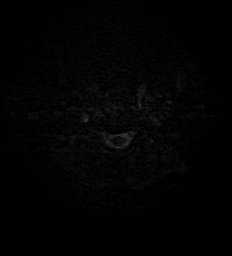
[im 30/60]
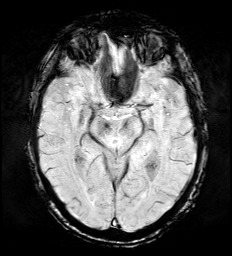
[im 60/60]
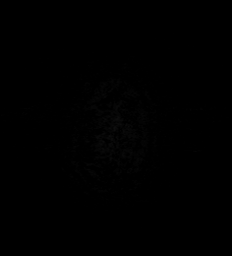

[Series 15: FLAIR · axial · 3.0mm · 0.53mm/px · z∈[-126,+35]mm · 3 of 55 slices shown]
[im 1/55]
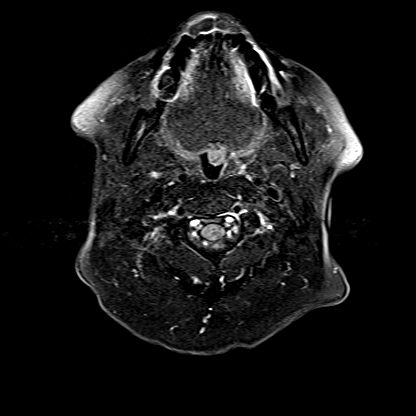
[im 28/55]
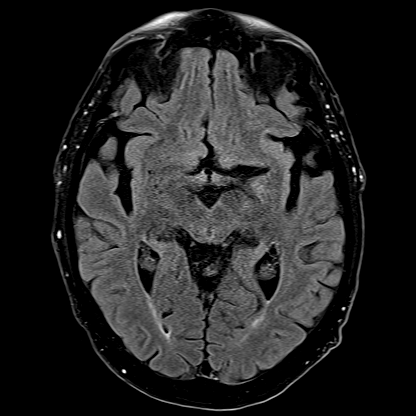
[im 55/55]
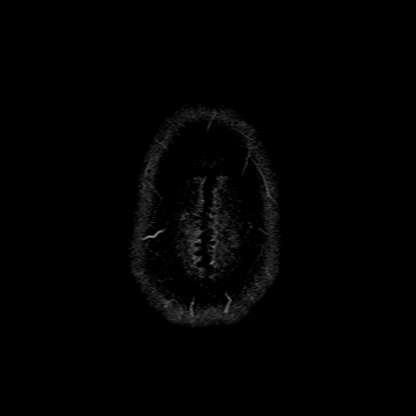

[Series 16: T1 · axial · 1.0mm · 0.98mm/px · z∈[-138,+36]mm · 9 of 176 slices shown (2 of 2)]
[im 1/176]
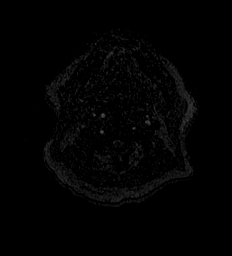
[im 22/176]
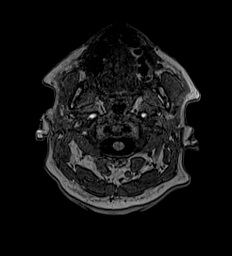
[im 44/176]
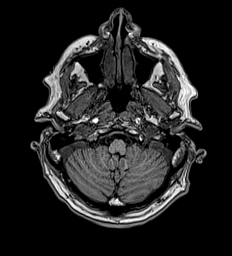
[im 66/176]
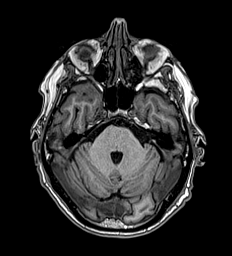
[im 88/176]
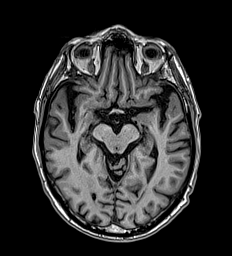
[im 110/176]
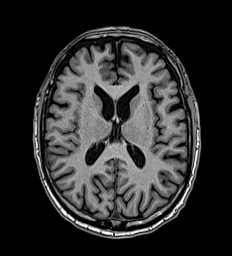
[im 132/176]
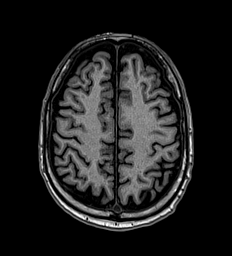
[im 154/176]
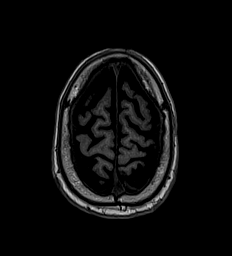
[im 176/176]
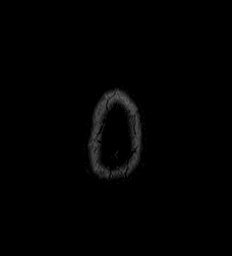

[Series 17: T2 post-contrast · coronal · 5.0mm · 0.57mm/px · 2 of 29 slices shown]
[im 1/29]
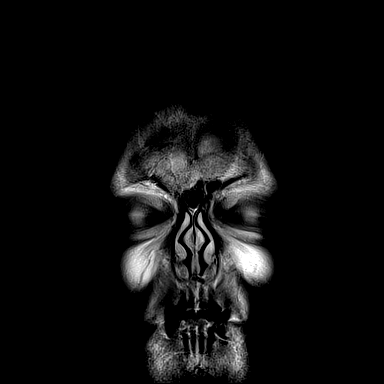
[im 29/29]
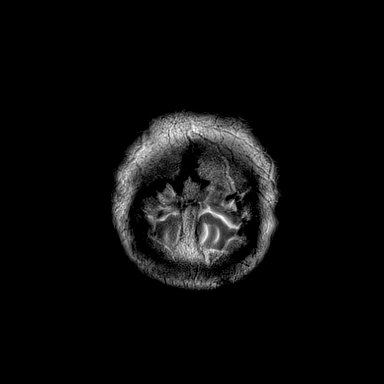

[Series 18: T1 post-contrast · axial · 1.0mm · 0.98mm/px · z∈[-138,+36]mm · 9 of 176 slices shown (1 of 3)]
[im 1/176]
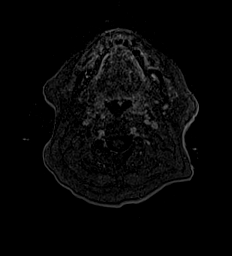
[im 22/176]
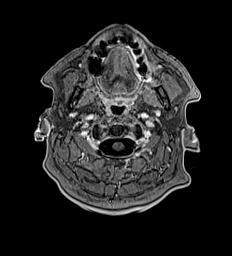
[im 44/176]
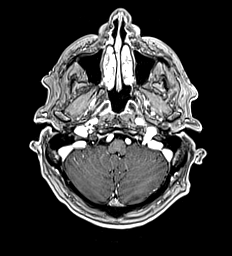
[im 66/176]
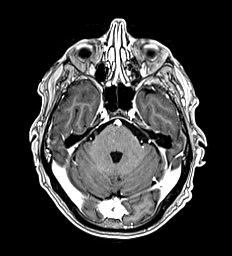
[im 88/176]
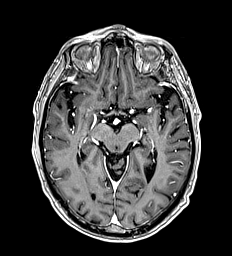
[im 110/176]
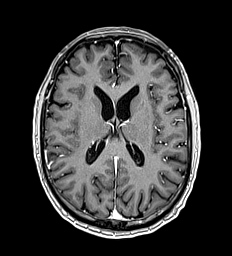
[im 132/176]
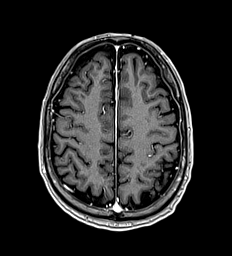
[im 154/176]
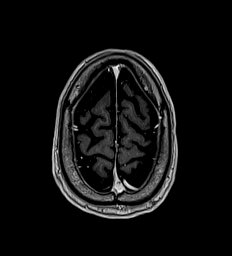
[im 176/176]
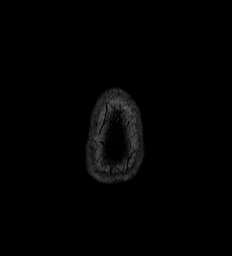

[Series 19: T1 post-contrast · coronal · 5.0mm · 0.57mm/px · 2 of 29 slices shown (2 of 3)]
[im 1/29]
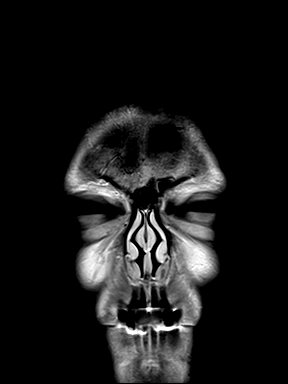
[im 29/29]
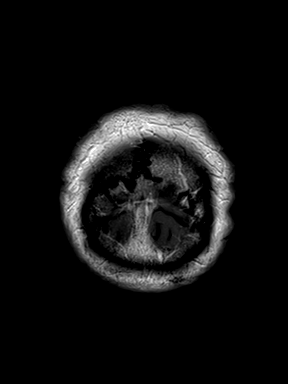

[Series 20: T1 post-contrast · sagittal · 5.0mm · 0.62mm/px · 1 of 25 slices shown (3 of 3)]
[im 1/25]
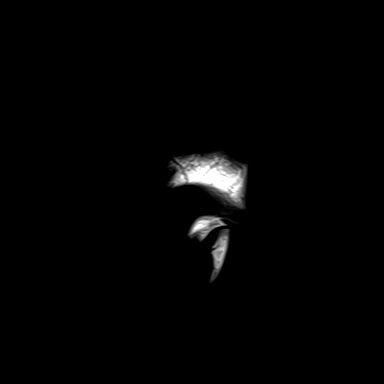

[48 of 48 positions shown; findings below may reference images not displayed]

FINDINGS: Brain: Cerebral volume normal for age. Negative for hydrocephalus.
Negative for acute infarct. Minimal chronic white matter changes.
Negative for hemorrhage or mass.

Normal enhancement following contrast administration.

Vascular: Normal arterial flow voids

Skull and upper cervical spine: Negative

Sinuses/Orbits: Minimal mucosal edema paranasal sinuses. Bilateral
cataract surgery.

Other: None
IMPRESSION: Normal for age.  Minimal chronic white matter ischemic change.

## 2021-09-22 ENCOUNTER — Ambulatory Visit: Payer: Medicare HMO | Admitting: Dermatology

## 2021-09-22 ENCOUNTER — Other Ambulatory Visit: Payer: Self-pay

## 2021-09-22 DIAGNOSIS — L578 Other skin changes due to chronic exposure to nonionizing radiation: Secondary | ICD-10-CM | POA: Diagnosis not present

## 2021-09-22 DIAGNOSIS — L820 Inflamed seborrheic keratosis: Secondary | ICD-10-CM

## 2021-09-22 DIAGNOSIS — C44229 Squamous cell carcinoma of skin of left ear and external auricular canal: Secondary | ICD-10-CM

## 2021-09-22 DIAGNOSIS — L821 Other seborrheic keratosis: Secondary | ICD-10-CM | POA: Diagnosis not present

## 2021-09-22 DIAGNOSIS — L570 Actinic keratosis: Secondary | ICD-10-CM | POA: Diagnosis not present

## 2021-09-22 DIAGNOSIS — D485 Neoplasm of uncertain behavior of skin: Secondary | ICD-10-CM

## 2021-09-22 NOTE — Patient Instructions (Addendum)

## 2021-09-22 NOTE — Progress Notes (Signed)
Follow-Up Visit   Subjective  Brandon Barrett is a 85 y.o. male who presents for the following: irregular skin lesion (On the L ear - x 2 mths, patient is concerned and would like it checked today.). The patient has spots, moles and lesions to be evaluated, some may be new or changing.   The following portions of the chart were reviewed this encounter and updated as appropriate:   Allergies  Meds  Problems  Med Hx  Surg Hx  Fam Hx     Review of Systems:  No other skin or systemic complaints except as noted in HPI or Assessment and Plan.  Objective  Well appearing patient in no apparent distress; mood and affect are within normal limits.  A focused examination was performed including the face, scalp, and ears. Relevant physical exam findings are noted in the Assessment and Plan.  Scalp and face x 3 (3) Erythematous thin papules/macules with gritty scale.   Left Ear 1.2 cm hyperkeratotic papule.      Scalp x 1 Erythematous keratotic or waxy stuck-on papule or plaque.    Assessment & Plan  AK (actinic keratosis) (3) Scalp and face x 3  Destruction of lesion - Scalp and face x 3 Complexity: simple   Destruction method: cryotherapy   Informed consent: discussed and consent obtained   Timeout:  patient name, date of birth, surgical site, and procedure verified Lesion destroyed using liquid nitrogen: Yes   Region frozen until ice ball extended beyond lesion: Yes   Outcome: patient tolerated procedure well with no complications   Post-procedure details: wound care instructions given    Neoplasm of uncertain behavior of skin Left Ear  Epidermal / dermal shaving  Lesion diameter (cm):  1.2 Informed consent: discussed and consent obtained   Timeout: patient name, date of birth, surgical site, and procedure verified   Procedure prep:  Patient was prepped and draped in usual sterile fashion Prep type:  Isopropyl alcohol Anesthesia: the lesion was anesthetized in a  standard fashion   Anesthetic:  1% lidocaine w/ epinephrine 1-100,000 buffered w/ 8.4% NaHCO3 Instrument used: flexible razor blade   Hemostasis achieved with: pressure, aluminum chloride and electrodesiccation   Outcome: patient tolerated procedure well   Post-procedure details: sterile dressing applied and wound care instructions given   Dressing type: bandage and petrolatum    Destruction of lesion Complexity: extensive   Destruction method: electrodesiccation and curettage   Informed consent: discussed and consent obtained   Timeout:  patient name, date of birth, surgical site, and procedure verified Procedure prep:  Patient was prepped and draped in usual sterile fashion Prep type:  Isopropyl alcohol Anesthesia: the lesion was anesthetized in a standard fashion   Anesthetic:  1% lidocaine w/ epinephrine 1-100,000 buffered w/ 8.4% NaHCO3 Curettage performed in three different directions: Yes   Electrodesiccation performed over the curetted area: Yes   Lesion length (cm):  1.2 Lesion width (cm):  1.2 Final wound size (cm):  1.2 Hemostasis achieved with:  pressure, aluminum chloride and electrodesiccation Outcome: patient tolerated procedure well with no complications   Post-procedure details: sterile dressing applied and wound care instructions given   Dressing type: bandage and petrolatum    Specimen 1 - Surgical pathology Differential Diagnosis: D48.5 r/o SCC vs other ED&C  Check Margins: No  Inflamed seborrheic keratosis Scalp x 1  Destruction of lesion - Scalp x 1 Complexity: simple   Destruction method: cryotherapy   Informed consent: discussed and consent obtained  Timeout:  patient name, date of birth, surgical site, and procedure verified Lesion destroyed using liquid nitrogen: Yes   Region frozen until ice ball extended beyond lesion: Yes   Outcome: patient tolerated procedure well with no complications   Post-procedure details: wound care instructions given     Seborrheic Keratoses - Stuck-on, waxy, tan-brown papules and/or plaques  - Benign-appearing - Discussed benign etiology and prognosis. - Observe - Call for any changes  Actinic Damage - chronic, secondary to cumulative UV radiation exposure/sun exposure over time - diffuse scaly erythematous macules with underlying dyspigmentation - Recommend daily broad spectrum sunscreen SPF 30+ to sun-exposed areas, reapply every 2 hours as needed.  - Recommend staying in the shade or wearing long sleeves, sun glasses (UVA+UVB protection) and wide brim hats (4-inch brim around the entire circumference of the hat). - Call for new or changing lesions.  Return in about 6 months (around 03/23/2022) for UBSE.  Luther Redo, CMA, am acting as scribe for Sarina Ser, MD . Documentation: I have reviewed the above documentation for accuracy and completeness, and I agree with the above.  Sarina Ser, MD

## 2021-09-25 ENCOUNTER — Telehealth: Payer: Self-pay

## 2021-09-25 NOTE — Telephone Encounter (Signed)
-----   Message from Ralene Bathe, MD sent at 09/24/2021 12:56 PM EST ----- Diagnosis Skin , left ear WELL DIFFERENTIATED SQUAMOUS CELL CARCINOMA, BASE INVOLVED  Cancer - SCC Already treated Recheck next visit

## 2021-09-25 NOTE — Telephone Encounter (Signed)
No answer, unable to leave a message. °

## 2021-09-27 ENCOUNTER — Encounter: Payer: Self-pay | Admitting: Dermatology

## 2021-10-09 ENCOUNTER — Telehealth: Payer: Self-pay

## 2021-10-09 NOTE — Telephone Encounter (Signed)
-----   Message from Ralene Bathe, MD sent at 09/24/2021 12:56 PM EST ----- Diagnosis Skin , left ear WELL DIFFERENTIATED SQUAMOUS CELL CARCINOMA, BASE INVOLVED  Cancer - SCC Already treated Recheck next visit

## 2021-10-09 NOTE — Telephone Encounter (Signed)
Patient's wife advised bx was SCC, already treated.Cherly Hensen

## 2022-03-23 ENCOUNTER — Ambulatory Visit: Payer: Medicare Other | Admitting: Dermatology

## 2022-03-23 DIAGNOSIS — D229 Melanocytic nevi, unspecified: Secondary | ICD-10-CM

## 2022-03-23 DIAGNOSIS — L578 Other skin changes due to chronic exposure to nonionizing radiation: Secondary | ICD-10-CM

## 2022-03-23 DIAGNOSIS — L570 Actinic keratosis: Secondary | ICD-10-CM

## 2022-03-23 DIAGNOSIS — Z85828 Personal history of other malignant neoplasm of skin: Secondary | ICD-10-CM | POA: Diagnosis not present

## 2022-03-23 DIAGNOSIS — L814 Other melanin hyperpigmentation: Secondary | ICD-10-CM

## 2022-03-23 DIAGNOSIS — Z1283 Encounter for screening for malignant neoplasm of skin: Secondary | ICD-10-CM | POA: Diagnosis not present

## 2022-03-23 DIAGNOSIS — L209 Atopic dermatitis, unspecified: Secondary | ICD-10-CM

## 2022-03-23 DIAGNOSIS — L820 Inflamed seborrheic keratosis: Secondary | ICD-10-CM | POA: Diagnosis not present

## 2022-03-23 DIAGNOSIS — D1800 Hemangioma unspecified site: Secondary | ICD-10-CM

## 2022-03-23 DIAGNOSIS — L821 Other seborrheic keratosis: Secondary | ICD-10-CM

## 2022-03-23 MED ORDER — MOMETASONE FUROATE 0.1 % EX CREA: TOPICAL_CREAM | CUTANEOUS | 0 refills | Status: AC

## 2022-03-23 NOTE — Progress Notes (Signed)
Follow-Up Visit   Subjective  Brandon Barrett is a 86 y.o. male who presents for the following: UBSE (Hx AK's, SCC, BCC - patient has noticed irregular skin lesions on the scalp and a rash on the R lower leg ). The patient presents for Upper Body Skin Exam (UBSE) for skin cancer screening and mole check.  The patient has spots, moles and lesions to be evaluated, some may be new or changing.  The following portions of the chart were reviewed this encounter and updated as appropriate:   Allergies  Meds  Problems  Med Hx  Surg Hx  Fam Hx      Review of Systems:  No other skin or systemic complaints except as noted in HPI or Assessment and Plan.  Objective  Well appearing patient in no apparent distress; mood and affect are within normal limits.  A focused examination was performed including the face, trunk, and extremities. Relevant physical exam findings are noted in the Assessment and Plan.  Scalp and face x 16 (16) Erythematous thin papules/macules with gritty scale.   Scalp x 5 (5) Erythematous stuck-on, waxy papule or plaque   Assessment & Plan  AK (actinic keratosis) (16) Scalp and face x 16  Destruction of lesion - Scalp and face x 16 Complexity: simple   Destruction method: cryotherapy   Informed consent: discussed and consent obtained   Timeout:  patient name, date of birth, surgical site, and procedure verified Lesion destroyed using liquid nitrogen: Yes   Region frozen until ice ball extended beyond lesion: Yes   Outcome: patient tolerated procedure well with no complications   Post-procedure details: wound care instructions given    Inflamed seborrheic keratosis (5) Scalp x 5  Destruction of lesion - Scalp x 5 Complexity: simple   Destruction method: cryotherapy   Informed consent: discussed and consent obtained   Timeout:  patient name, date of birth, surgical site, and procedure verified Lesion destroyed using liquid nitrogen: Yes   Region frozen  until ice ball extended beyond lesion: Yes   Outcome: patient tolerated procedure well with no complications   Post-procedure details: wound care instructions given    Atopic dermatitis, unspecified type R lower leg  Atopic dermatitis (eczema) is a chronic, relapsing, pruritic condition that can significantly affect quality of life. It is often associated with allergic rhinitis and/or asthma and can require treatment with topical medications, phototherapy, or in severe cases biologic injectable medication (Dupixent; Adbry) or Oral JAK inhibitors.  Start Mometasone 0.1% cream to aa's QD up to 5d/wk. Topical steroids (such as triamcinolone, fluocinolone, fluocinonide, mometasone, clobetasol, halobetasol, betamethasone, hydrocortisone) can cause thinning and lightening of the skin if they are used for too long in the same area. Your physician has selected the right strength medicine for your problem and area affected on the body. Please use your medication only as directed by your physician to prevent side effects.    mometasone (ELOCON) 0.1 % cream - R lower leg Apply to right ankle rash QD up to 5 days per week.  Lentigines - Scattered tan macules - Due to sun exposure - Benign-appearing, observe - Recommend daily broad spectrum sunscreen SPF 30+ to sun-exposed areas, reapply every 2 hours as needed. - Call for any changes  Seborrheic Keratoses - Stuck-on, waxy, tan-brown papules and/or plaques  - Benign-appearing - Discussed benign etiology and prognosis. - Observe - Call for any changes  Melanocytic Nevi - Tan-brown and/or pink-flesh-colored symmetric macules and papules - Benign appearing on  exam today - Observation - Call clinic for new or changing moles - Recommend daily use of broad spectrum spf 30+ sunscreen to sun-exposed areas.   Hemangiomas - Red papules - Discussed benign nature - Observe - Call for any changes  Actinic Damage - Chronic condition, secondary to  cumulative UV/sun exposure - diffuse scaly erythematous macules with underlying dyspigmentation - Recommend daily broad spectrum sunscreen SPF 30+ to sun-exposed areas, reapply every 2 hours as needed.  - Staying in the shade or wearing long sleeves, sun glasses (UVA+UVB protection) and wide brim hats (4-inch brim around the entire circumference of the hat) are also recommended for sun protection.  - Call for new or changing lesions.  History of Squamous Cell Carcinoma of the Skin - No evidence of recurrence today - No lymphadenopathy - Recommend regular full body skin exams - Recommend daily broad spectrum sunscreen SPF 30+ to sun-exposed areas, reapply every 2 hours as needed.  - Call if any new or changing lesions are noted between office visits  Skin cancer screening performed today.  Return in about 6 months (around 09/22/2022).  Luther Redo, CMA, am acting as scribe for Sarina Ser, MD . Documentation: I have reviewed the above documentation for accuracy and completeness, and I agree with the above.  Sarina Ser, MD

## 2022-03-23 NOTE — Patient Instructions (Signed)
Due to recent changes in healthcare laws, you may see results of your pathology and/or laboratory studies on MyChart before the doctors have had a chance to review them. We understand that in some cases there may be results that are confusing or concerning to you. Please understand that not all results are received at the same time and often the doctors may need to interpret multiple results in order to provide you with the best plan of care or course of treatment. Therefore, we ask that you please give us 2 business days to thoroughly review all your results before contacting the office for clarification. Should we see a critical lab result, you will be contacted sooner.   If You Need Anything After Your Visit  If you have any questions or concerns for your doctor, please call our main line at [phone] and press option 4 to reach your doctor's medical assistant. If no one answers, please leave a voicemail as directed and we will return your call as soon as possible. Messages left after 4 pm will be answered the following business day.   You may also send us a message via MyChart. We typically respond to MyChart messages within 1-2 business days.  For prescription refills, please ask your pharmacy to contact our office. Our fax number is [phone].  If you have an urgent issue when the clinic is closed that cannot wait until the next business day, you can page your doctor at the number below.    Please note that while we do our best to be available for urgent issues outside of office hours, we are not available 24/7.   If you have an urgent issue and are unable to reach us, you may choose to seek medical care at your doctor's office, retail clinic, urgent care center, or emergency room.  If you have a medical emergency, please immediately call 911 or go to the emergency department.  Pager Numbers  - Dr. Kowalski: [phone]  - Dr. Moye: [phone]  - Dr. Stewart:  [phone]  In the event of inclement weather, please call our main line at [phone] for an update on the status of any delays or closures.  Dermatology Medication Tips: Please keep the boxes that topical medications come in in order to help keep track of the instructions about where and how to use these. Pharmacies typically print the medication instructions only on the boxes and not directly on the medication tubes.   If your medication is too expensive, please contact our office at [phone] option 4 or send us a message through MyChart.   We are unable to tell what your co-pay for medications will be in advance as this is different depending on your insurance coverage. However, we may be able to find a substitute medication at lower cost or fill out paperwork to get insurance to cover a needed medication.   If a prior authorization is required to get your medication covered by your insurance company, please allow us 1-2 business days to complete this process.  Drug prices often vary depending on where the prescription is filled and some pharmacies may offer cheaper prices.  The website www.goodrx.com contains coupons for medications through different pharmacies. The prices here do not account for what the cost may be with help from insurance (it may be cheaper with your insurance), but the website can give you the price if you did not use any insurance.  - You can print the associated coupon and take it with   your prescription to the pharmacy.  - You may also stop by our office during regular business hours and pick up a GoodRx coupon card.  - If you need your prescription sent electronically to a different pharmacy, notify our office through [facility] MyChart or by phone at [phone] option 4.     Si Usted Necesita Algo Despus de Su Visita  Tambin puede enviarnos un mensaje a travs de MyChart. Por lo general respondemos a los mensajes de MyChart en el transcurso de 1 a 2  das hbiles.  Para renovar recetas, por favor pida a su farmacia que se ponga en contacto con nuestra oficina. Nuestro nmero de fax es el [phone].  Si tiene un asunto urgente cuando la clnica est cerrada y que no puede esperar hasta el siguiente da hbil, puede llamar/localizar a su doctor(a) al nmero que aparece a continuacin.   Por favor, tenga en cuenta que aunque hacemos todo lo posible para estar disponibles para asuntos urgentes fuera del horario de oficina, no estamos disponibles las 24 horas del da, los 7 das de la semana.   Si tiene un problema urgente y no puede comunicarse con nosotros, puede optar por buscar atencin mdica  en el consultorio de su doctor(a), en una clnica privada, en un centro de atencin urgente o en una sala de emergencias.  Si tiene una emergencia mdica, por favor llame inmediatamente al 911 o vaya a la sala de emergencias.  Nmeros de bper  - Dr. Kowalski: [phone]  - Dra. Moye: [phone]  - Dra. Stewart: [phone]  En caso de inclemencias del tiempo, por favor llame a nuestra lnea principal al [phone] para una actualizacin sobre el estado de cualquier retraso o cierre.  Consejos para la medicacin en dermatologa: Por favor, guarde las cajas en las que vienen los medicamentos de uso tpico para ayudarle a seguir las instrucciones sobre dnde y cmo usarlos. Las farmacias generalmente imprimen las instrucciones del medicamento slo en las cajas y no directamente en los tubos del medicamento.   Si su medicamento es muy caro, por favor, pngase en contacto con nuestra oficina llamando al [phone] y presione la opcin 4 o envenos un mensaje a travs de MyChart.   No podemos decirle cul ser su copago por los medicamentos por adelantado ya que esto es diferente dependiendo de la cobertura de su seguro. Sin embargo, es posible que podamos encontrar un medicamento sustituto a menor costo o llenar un formulario para que el  seguro cubra el medicamento que se considera necesario.   Si se requiere una autorizacin previa para que su compaa de seguros cubra su medicamento, por favor permtanos de 1 a 2 das hbiles para completar este proceso.  Los precios de los medicamentos varan con frecuencia dependiendo del lugar de dnde se surte la receta y alguna farmacias pueden ofrecer precios ms baratos.  El sitio web www.goodrx.com tiene cupones para medicamentos de diferentes farmacias. Los precios aqu no tienen en cuenta lo que podra costar con la ayuda del seguro (puede ser ms barato con su seguro), pero el sitio web puede darle el precio si no utiliz ningn seguro.  - Puede imprimir el cupn correspondiente y llevarlo con su receta a la farmacia.  - Tambin puede pasar por nuestra oficina durante el horario de atencin regular y recoger una tarjeta de cupones de GoodRx.  - Si necesita que su receta se enve electrnicamente a una farmacia diferente, informe a nuestra oficina a travs de MyChart de [facility]   o por telfono llamando al [phone] y presione la opcin 4.  

## 2022-03-24 ENCOUNTER — Encounter: Payer: Self-pay | Admitting: Dermatology

## 2022-06-29 ENCOUNTER — Other Ambulatory Visit: Payer: Self-pay | Admitting: Student

## 2022-06-29 ENCOUNTER — Ambulatory Visit
Admission: RE | Admit: 2022-06-29 | Discharge: 2022-06-29 | Disposition: A | Discharge status: Home / Self Care | Payer: Medicare Other | Source: Ambulatory Visit | Attending: Student | Admitting: Student

## 2022-06-29 DIAGNOSIS — R1310 Dysphagia, unspecified: Secondary | ICD-10-CM

## 2022-06-30 ENCOUNTER — Other Ambulatory Visit: Payer: Self-pay | Admitting: Student

## 2022-06-30 ENCOUNTER — Other Ambulatory Visit (HOSPITAL_COMMUNITY): Payer: Self-pay | Admitting: Student

## 2022-06-30 DIAGNOSIS — R569 Unspecified convulsions: Secondary | ICD-10-CM

## 2022-06-30 DIAGNOSIS — R4189 Other symptoms and signs involving cognitive functions and awareness: Secondary | ICD-10-CM

## 2022-07-07 ENCOUNTER — Ambulatory Visit (HOSPITAL_COMMUNITY)
Admission: RE | Admit: 2022-07-07 | Discharge: 2022-07-07 | Disposition: A | Discharge status: Home / Self Care | Payer: Medicare Other | Source: Ambulatory Visit | Attending: Student | Admitting: Student

## 2022-07-07 ENCOUNTER — Ambulatory Visit: Admission: RE | Admit: 2022-07-07 | Payer: Medicare Other | Source: Ambulatory Visit

## 2022-07-07 DIAGNOSIS — R569 Unspecified convulsions: Secondary | ICD-10-CM | POA: Insufficient documentation

## 2022-07-07 DIAGNOSIS — R4189 Other symptoms and signs involving cognitive functions and awareness: Secondary | ICD-10-CM | POA: Diagnosis present

## 2022-07-20 ENCOUNTER — Other Ambulatory Visit: Payer: Self-pay | Admitting: Internal Medicine

## 2022-07-20 DIAGNOSIS — I2089 Other forms of angina pectoris: Secondary | ICD-10-CM

## 2022-09-14 ENCOUNTER — Other Ambulatory Visit
Admission: RE | Admit: 2022-09-14 | Discharge: 2022-09-14 | Disposition: A | Discharge status: Home / Self Care | Payer: Medicare Other | Source: Ambulatory Visit | Attending: Internal Medicine | Admitting: Internal Medicine

## 2022-09-14 DIAGNOSIS — Z01810 Encounter for preprocedural cardiovascular examination: Secondary | ICD-10-CM | POA: Insufficient documentation

## 2022-09-14 DIAGNOSIS — I2089 Other forms of angina pectoris: Secondary | ICD-10-CM | POA: Insufficient documentation

## 2022-09-14 DIAGNOSIS — R55 Syncope and collapse: Secondary | ICD-10-CM | POA: Diagnosis present

## 2022-09-14 LAB — BRAIN NATRIURETIC PEPTIDE: B Natriuretic Peptide: 50 pg/mL (ref 0.0–100.0)

## 2022-09-15 ENCOUNTER — Encounter (HOSPITAL_COMMUNITY): Payer: Self-pay

## 2022-09-15 ENCOUNTER — Telehealth (HOSPITAL_COMMUNITY): Payer: Self-pay | Admitting: *Deleted

## 2022-09-15 NOTE — Telephone Encounter (Signed)
Reaching out to patient regarding his Cardiac CT appointment. The daughter states that he has a TEE scheduled on the same day. New appointment made for Monday, December 18 at 9:30am.  Will send instructions to patient's Mychart.   Patient will take his daily propanolol two hours prior to his cardiac CT scan.  Gordy Clement RN Navigator Cardiac Imaging [facility] [phone] Office [phone] Cell

## 2022-09-16 MED ORDER — SODIUM CHLORIDE 0.9 % IV SOLN: INTRAVENOUS | Status: DC

## 2022-09-17 ENCOUNTER — Ambulatory Visit
Admission: RE | Admit: 2022-09-17 | Discharge: 2022-09-17 | Disposition: A | Discharge status: Home / Self Care | Payer: Medicare Other | Source: Ambulatory Visit | Attending: Physician Assistant | Admitting: Physician Assistant

## 2022-09-17 ENCOUNTER — Ambulatory Visit: Admission: RE | Admit: 2022-09-17 | Payer: Medicare Other | Source: Ambulatory Visit

## 2022-09-17 ENCOUNTER — Ambulatory Visit
Admission: RE | Admit: 2022-09-17 | Discharge: 2022-09-17 | Disposition: A | Discharge status: Home / Self Care | Payer: Medicare Other | Source: Ambulatory Visit | Attending: Internal Medicine | Admitting: Internal Medicine

## 2022-09-17 ENCOUNTER — Encounter
Admission: RE | Disposition: A | Discharge status: Home / Self Care | Payer: Self-pay | Source: Ambulatory Visit | Attending: Internal Medicine

## 2022-09-17 ENCOUNTER — Encounter: Payer: Self-pay | Admitting: Internal Medicine

## 2022-09-17 DIAGNOSIS — R0602 Shortness of breath: Secondary | ICD-10-CM | POA: Insufficient documentation

## 2022-09-17 DIAGNOSIS — Q2112 Patent foramen ovale: Secondary | ICD-10-CM | POA: Diagnosis not present

## 2022-09-17 DIAGNOSIS — I340 Nonrheumatic mitral (valve) insufficiency: Secondary | ICD-10-CM | POA: Insufficient documentation

## 2022-09-17 HISTORY — PX: TEE WITHOUT CARDIOVERSION: SHX5443

## 2022-09-17 LAB — GLUCOSE, CAPILLARY: Glucose-Capillary: 140 mg/dL — ABNORMAL HIGH (ref 70–99)

## 2022-09-17 SURGERY — ECHOCARDIOGRAM, TRANSESOPHAGEAL
Anesthesia: Moderate Sedation

## 2022-09-17 MED ORDER — MIDAZOLAM HCL 2 MG/2ML IJ SOLN
INTRAMUSCULAR | Status: AC
  Filled 2022-09-17: qty 4

## 2022-09-17 MED ORDER — LIDOCAINE VISCOUS HCL 2 % MT SOLN
OROMUCOSAL | Status: AC
  Administered 2022-09-17: 15 mL
  Filled 2022-09-17: qty 15

## 2022-09-17 MED ORDER — FENTANYL CITRATE (PF) 100 MCG/2ML IJ SOLN
INTRAMUSCULAR | Status: AC | PRN
  Administered 2022-09-17: 25 ug via INTRAVENOUS

## 2022-09-17 MED ORDER — LORAZEPAM 2 MG/ML IJ SOLN
INTRAMUSCULAR | Status: AC | PRN
  Administered 2022-09-17: 1 mg via INTRAVENOUS

## 2022-09-17 MED ORDER — FENTANYL CITRATE (PF) 100 MCG/2ML IJ SOLN
INTRAMUSCULAR | Status: AC
  Filled 2022-09-17: qty 2

## 2022-09-17 MED ORDER — BUTAMBEN-TETRACAINE-BENZOCAINE 2-2-14 % EX AERO
INHALATION_SPRAY | CUTANEOUS | Status: AC
  Administered 2022-09-17: 3
  Filled 2022-09-17: qty 5

## 2022-09-17 NOTE — Progress Notes (Signed)
*  PRELIMINARY RESULTS* Echocardiogram Echocardiogram Transesophageal has been performed.  Brandon Barrett 09/17/2022, 9:02 AM

## 2022-09-17 NOTE — CV Procedure (Signed)
Transesophageal echocardiogram preliminary report  CEEJAY KEGLEY [id number] June 10, 1936  Preliminary diagnosis Symptomatic valvular heart disease  Postprocedural diagnosis Normal LV systolic function without symptomatic valvular heart disease PFO  Time out A timeout was performed by the nursing staff and physicians specifically identifying the procedure performed, identification of the patient, the type of sedation, all allergies and medications, all pertinent medical history, and presedation assessment of nasopharynx. The patient and or family understand the risks of the procedure including the rare risks of death, stroke, heart attack, esophogeal perforation, sore throat, and reaction to medications given.  Moderate sedation During this procedure the patient has received Versed 2 milligrams and fentanyl 50 micrograms to achieve appropriate moderate sedation.  The patient had continued monitoring of heart rate, oxygenation, blood pressure, respiratory rate, and extent of signs of sedation throughout the entire procedure.  The patient received this moderate sedation over a period of 14 minutes.  Both the nursing staff and I were present during the procedure when the patient had moderate sedation for 100% of the time.  Treatment considerations  No further intervention of valvular heart disease  For further details of transesophageal echocardiogram please refer to final report.  Signed,  [name] M.D. [facility] 09/17/2022 8:53 AM

## 2022-09-25 ENCOUNTER — Telehealth (HOSPITAL_COMMUNITY): Payer: Self-pay | Admitting: Emergency Medicine

## 2022-09-25 NOTE — Telephone Encounter (Signed)
Attempted to call patient regarding upcoming cardiac CT appointment. °Left message on voicemail with name and callback number °Aleece Loyd RN Navigator Cardiac Imaging °[facility] °[phone] Office °[phone] Cell ° °

## 2022-09-28 ENCOUNTER — Other Ambulatory Visit: Payer: Self-pay | Admitting: Internal Medicine

## 2022-09-28 ENCOUNTER — Ambulatory Visit: Payer: Medicare Other | Admitting: Dermatology

## 2022-09-28 ENCOUNTER — Ambulatory Visit
Admission: RE | Admit: 2022-09-28 | Discharge: 2022-09-28 | Disposition: A | Discharge status: Home / Self Care | Payer: Medicare Other | Source: Ambulatory Visit | Attending: Internal Medicine | Admitting: Internal Medicine

## 2022-09-28 ENCOUNTER — Ambulatory Visit
Admission: RE | Admit: 2022-09-28 | Discharge: 2022-09-28 | Disposition: A | Discharge status: Home / Self Care | Payer: Medicare Other | Source: Ambulatory Visit | Attending: Cardiology | Admitting: Cardiology

## 2022-09-28 DIAGNOSIS — L821 Other seborrheic keratosis: Secondary | ICD-10-CM | POA: Diagnosis not present

## 2022-09-28 DIAGNOSIS — I2089 Other forms of angina pectoris: Secondary | ICD-10-CM | POA: Diagnosis present

## 2022-09-28 DIAGNOSIS — C44319 Basal cell carcinoma of skin of other parts of face: Secondary | ICD-10-CM | POA: Diagnosis not present

## 2022-09-28 DIAGNOSIS — I25118 Atherosclerotic heart disease of native coronary artery with other forms of angina pectoris: Secondary | ICD-10-CM | POA: Insufficient documentation

## 2022-09-28 DIAGNOSIS — L820 Inflamed seborrheic keratosis: Secondary | ICD-10-CM | POA: Diagnosis not present

## 2022-09-28 DIAGNOSIS — L570 Actinic keratosis: Secondary | ICD-10-CM

## 2022-09-28 DIAGNOSIS — R931 Abnormal findings on diagnostic imaging of heart and coronary circulation: Secondary | ICD-10-CM | POA: Diagnosis not present

## 2022-09-28 DIAGNOSIS — D492 Neoplasm of unspecified behavior of bone, soft tissue, and skin: Secondary | ICD-10-CM

## 2022-09-28 DIAGNOSIS — C4491 Basal cell carcinoma of skin, unspecified: Secondary | ICD-10-CM

## 2022-09-28 DIAGNOSIS — I517 Cardiomegaly: Secondary | ICD-10-CM | POA: Diagnosis not present

## 2022-09-28 DIAGNOSIS — L578 Other skin changes due to chronic exposure to nonionizing radiation: Secondary | ICD-10-CM | POA: Diagnosis not present

## 2022-09-28 HISTORY — DX: Basal cell carcinoma of skin, unspecified: C44.91

## 2022-09-28 MED ORDER — IOHEXOL 350 MG/ML SOLN
80.0000 mL | Freq: Once | INTRAVENOUS | Status: AC | PRN
  Administered 2022-09-28: 80 mL via INTRAVENOUS

## 2022-09-28 MED ORDER — NITROGLYCERIN 0.4 MG SL SUBL
0.8000 mg | SUBLINGUAL_TABLET | Freq: Once | SUBLINGUAL | Status: AC
  Administered 2022-09-28: 0.8 mg via SUBLINGUAL

## 2022-09-28 MED ORDER — NITROGLYCERIN 0.4 MG SL SUBL
SUBLINGUAL_TABLET | SUBLINGUAL | Status: AC
  Filled 2022-09-28: qty 1

## 2022-09-28 NOTE — Progress Notes (Signed)
Follow-Up Visit   Subjective  Brandon Barrett is a 86 y.o. male who presents for the following: Follow-up (6 months on his face and scalp, hx of precancers.). The patient has spots, moles and lesions to be evaluated, some may be new or changing and the patient has concerns that these could be cancer.  Wife with patient   The following portions of the chart were reviewed this encounter and updated as appropriate:   Tobacco  Allergies  Meds  Problems  Med Hx  Surg Hx  Fam Hx     Review of Systems:  No other skin or systemic complaints except as noted in HPI or Assessment and Plan.  Objective  Well appearing patient in no apparent distress; mood and affect are within normal limits.  A focused examination was performed including face,scalp. Relevant physical exam findings are noted in the Assessment and Plan.  left forehead 2cm above the brow 0.6 cm erythematous papule        scalp x 4 (4) Erythematous thin papules/macules with gritty scale.   Scalp x 29 (29) Stuck-on, waxy, tan-brown papules -- Discussed benign etiology and prognosis.    Assessment & Plan  Neoplasm of skin left forehead 2cm above the brow  Epidermal / dermal shaving  Lesion diameter (cm):  0.6 Informed consent: discussed and consent obtained   Timeout: patient name, date of birth, surgical site, and procedure verified   Procedure prep:  Patient was prepped and draped in usual sterile fashion Prep type:  Isopropyl alcohol Anesthesia: the lesion was anesthetized in a standard fashion   Anesthetic:  1% lidocaine w/ epinephrine 1-100,000 buffered w/ 8.4% NaHCO3 Hemostasis achieved with: pressure, aluminum chloride and electrodesiccation   Outcome: patient tolerated procedure well   Post-procedure details: sterile dressing applied and wound care instructions given   Dressing type: bandage and petrolatum    Destruction of lesion  Destruction method: electrodesiccation and curettage    Informed consent: discussed and consent obtained   Timeout:  patient name, date of birth, surgical site, and procedure verified Anesthesia: the lesion was anesthetized in a standard fashion   Anesthetic:  1% lidocaine w/ epinephrine 1-100,000 buffered w/ 8.4% NaHCO3 Curettage performed in three different directions: Yes   Electrodesiccation performed over the curetted area: Yes   Curettage cycles:  3 Lesion length (cm):  0.6 Lesion width (cm):  0.6 Margin per side (cm):  0.2 Final wound size (cm):  1 Hemostasis achieved with:  electrodesiccation Outcome: patient tolerated procedure well with no complications   Post-procedure details: sterile dressing applied and wound care instructions given   Dressing type: petrolatum    Specimen 1 - Surgical pathology Differential Diagnosis: R/O Cyst vs BCC # 2 pieces in the container   Check Margins: No  AK (actinic keratosis) (4) scalp x 4  Actinic keratoses are precancerous spots that appear secondary to cumulative UV radiation exposure/sun exposure over time. They are chronic with expected duration over 1 year. A portion of actinic keratoses will progress to squamous cell carcinoma of the skin. It is not possible to reliably predict which spots will progress to skin cancer and so treatment is recommended to prevent development of skin cancer.  Recommend daily broad spectrum sunscreen SPF 30+ to sun-exposed areas, reapply every 2 hours as needed.  Recommend staying in the shade or wearing long sleeves, sun glasses (UVA+UVB protection) and wide brim hats (4-inch brim around the entire circumference of the hat). Call for new or changing lesions.  Destruction of lesion - scalp x 4 Complexity: simple   Destruction method: cryotherapy   Informed consent: discussed and consent obtained   Timeout:  patient name, date of birth, surgical site, and procedure verified Lesion destroyed using liquid nitrogen: Yes   Region frozen until ice ball  extended beyond lesion: Yes   Outcome: patient tolerated procedure well with no complications   Post-procedure details: wound care instructions given    Inflamed seborrheic keratosis (29) Scalp x 29  Symptomatic, irritating, patient would like treated.   Destruction of lesion - Scalp x 29 Complexity: simple   Destruction method: cryotherapy   Informed consent: discussed and consent obtained   Timeout:  patient name, date of birth, surgical site, and procedure verified Lesion destroyed using liquid nitrogen: Yes   Region frozen until ice ball extended beyond lesion: Yes   Outcome: patient tolerated procedure well with no complications   Post-procedure details: wound care instructions given    Actinic Damage - chronic, secondary to cumulative UV radiation exposure/sun exposure over time - diffuse scaly erythematous macules with underlying dyspigmentation - Recommend daily broad spectrum sunscreen SPF 30+ to sun-exposed areas, reapply every 2 hours as needed.  - Recommend staying in the shade or wearing long sleeves, sun glasses (UVA+UVB protection) and wide brim hats (4-inch brim around the entire circumference of the hat). - Call for new or changing lesions.   Seborrheic Keratoses - Stuck-on, waxy, tan-brown papules and/or plaques  - Benign-appearing - Discussed benign etiology and prognosis. - Observe - Call for any changes  Return in about 6 months (around 03/30/2023) for Aks .  IMarye Round, CMA, am acting as scribe for Sarina Ser, MD .  Documentation: I have reviewed the above documentation for accuracy and completeness, and I agree with the above.  Sarina Ser, MD

## 2022-09-28 NOTE — Patient Instructions (Addendum)
Wound Care Instructions  Cleanse wound gently with soap and water once a day then pat dry with clean gauze. Apply a thin coat of Petrolatum (petroleum jelly, "Vaseline") over the wound (unless you have an allergy to this). We recommend that you use a new, sterile tube of Vaseline. Do not pick or remove scabs. Do not remove the yellow or white "healing tissue" from the base of the wound.  Cover the wound with fresh, clean, nonstick gauze and secure with paper tape. You may use Band-Aids in place of gauze and tape if the wound is small enough, but would recommend trimming much of the tape off as there is often too much. Sometimes Band-Aids can irritate the skin.  You should call the office for your biopsy report after 1 week if you have not already been contacted.  If you experience any problems, such as abnormal amounts of bleeding, swelling, significant bruising, significant pain, or evidence of infection, please call the office immediately.  FOR ADULT SURGERY PATIENTS: If you need something for pain relief you may take 1 extra strength Tylenol (acetaminophen) AND 2 Ibuprofen (200mg each) together every 4 hours as needed for pain. (do not take these if you are allergic to them or if you have a reason you should not take them.) Typically, you may only need pain medication for 1 to 3 days.     Cryotherapy Aftercare  Wash gently with soap and water everyday.   Apply Vaseline and Band-Aid daily until healed.    Due to recent changes in healthcare laws, you may see results of your pathology and/or laboratory studies on MyChart before the doctors have had a chance to review them. We understand that in some cases there may be results that are confusing or concerning to you. Please understand that not all results are received at the same time and often the doctors may need to interpret multiple results in order to provide you with the best plan of care or course of treatment. Therefore, we ask that you  please give us 2 business days to thoroughly review all your results before contacting the office for clarification. Should we see a critical lab result, you will be contacted sooner.   If You Need Anything After Your Visit  If you have any questions or concerns for your doctor, please call our main line at [phone] and press option 4 to reach your doctor's medical assistant. If no one answers, please leave a voicemail as directed and we will return your call as soon as possible. Messages left after 4 pm will be answered the following business day.   You may also send us a message via MyChart. We typically respond to MyChart messages within 1-2 business days.  For prescription refills, please ask your pharmacy to contact our office. Our fax number is [phone].  If you have an urgent issue when the clinic is closed that cannot wait until the next business day, you can page your doctor at the number below.    Please note that while we do our best to be available for urgent issues outside of office hours, we are not available 24/7.   If you have an urgent issue and are unable to reach us, you may choose to seek medical care at your doctor's office, retail clinic, urgent care center, or emergency room.  If you have a medical emergency, please immediately call 911 or go to the emergency department.  Pager Numbers  - Dr. Kowalski: [phone]  -   Dr. Moye: [phone]  - Dr. Stewart: [phone]  In the event of inclement weather, please call our main line at [phone] for an update on the status of any delays or closures.  Dermatology Medication Tips: Please keep the boxes that topical medications come in in order to help keep track of the instructions about where and how to use these. Pharmacies typically print the medication instructions only on the boxes and not directly on the medication tubes.   If your medication is too expensive, please contact our office at  [phone] option 4 or send us a message through MyChart.   We are unable to tell what your co-pay for medications will be in advance as this is different depending on your insurance coverage. However, we may be able to find a substitute medication at lower cost or fill out paperwork to get insurance to cover a needed medication.   If a prior authorization is required to get your medication covered by your insurance company, please allow us 1-2 business days to complete this process.  Drug prices often vary depending on where the prescription is filled and some pharmacies may offer cheaper prices.  The website www.goodrx.com contains coupons for medications through different pharmacies. The prices here do not account for what the cost may be with help from insurance (it may be cheaper with your insurance), but the website can give you the price if you did not use any insurance.  - You can print the associated coupon and take it with your prescription to the pharmacy.  - You may also stop by our office during regular business hours and pick up a GoodRx coupon card.  - If you need your prescription sent electronically to a different pharmacy, notify our office through [facility] MyChart or by phone at [phone] option 4.     Si Usted Necesita Algo Despus de Su Visita  Tambin puede enviarnos un mensaje a travs de MyChart. Por lo general respondemos a los mensajes de MyChart en el transcurso de 1 a 2 das hbiles.  Para renovar recetas, por favor pida a su farmacia que se ponga en contacto con nuestra oficina. Nuestro nmero de fax es el [phone].  Si tiene un asunto urgente cuando la clnica est cerrada y que no puede esperar hasta el siguiente da hbil, puede llamar/localizar a su doctor(a) al nmero que aparece a continuacin.   Por favor, tenga en cuenta que aunque hacemos todo lo posible para estar disponibles para asuntos urgentes fuera del horario de oficina, no estamos  disponibles las 24 horas del da, los 7 das de la semana.   Si tiene un problema urgente y no puede comunicarse con nosotros, puede optar por buscar atencin mdica  en el consultorio de su doctor(a), en una clnica privada, en un centro de atencin urgente o en una sala de emergencias.  Si tiene una emergencia mdica, por favor llame inmediatamente al 911 o vaya a la sala de emergencias.  Nmeros de bper  - Dr. Kowalski: [phone]  - Dra. Moye: [phone]  - Dra. Stewart: [phone]  En caso de inclemencias del tiempo, por favor llame a nuestra lnea principal al [phone] para una actualizacin sobre el estado de cualquier retraso o cierre.  Consejos para la medicacin en dermatologa: Por favor, guarde las cajas en las que vienen los medicamentos de uso tpico para ayudarle a seguir las instrucciones sobre dnde y cmo usarlos. Las farmacias generalmente imprimen las instrucciones del medicamento slo en las cajas y   no directamente en los tubos del medicamento.   Si su medicamento es muy caro, por favor, pngase en contacto con nuestra oficina llamando al [phone] y presione la opcin 4 o envenos un mensaje a travs de MyChart.   No podemos decirle cul ser su copago por los medicamentos por adelantado ya que esto es diferente dependiendo de la cobertura de su seguro. Sin embargo, es posible que podamos encontrar un medicamento sustituto a menor costo o llenar un formulario para que el seguro cubra el medicamento que se considera necesario.   Si se requiere una autorizacin previa para que su compaa de seguros cubra su medicamento, por favor permtanos de 1 a 2 das hbiles para completar este proceso.  Los precios de los medicamentos varan con frecuencia dependiendo del lugar de dnde se surte la receta y alguna farmacias pueden ofrecer precios ms baratos.  El sitio web www.goodrx.com tiene cupones para medicamentos de diferentes farmacias. Los precios aqu no  tienen en cuenta lo que podra costar con la ayuda del seguro (puede ser ms barato con su seguro), pero el sitio web puede darle el precio si no utiliz ningn seguro.  - Puede imprimir el cupn correspondiente y llevarlo con su receta a la farmacia.  - Tambin puede pasar por nuestra oficina durante el horario de atencin regular y recoger una tarjeta de cupones de GoodRx.  - Si necesita que su receta se enve electrnicamente a una farmacia diferente, informe a nuestra oficina a travs de MyChart de [place] o por telfono llamando al [phone] y presione la opcin 4.  

## 2022-09-28 NOTE — Progress Notes (Signed)
Patient tolerated procedure well. Ambulate w/o difficulty. Denies any lightheadedness or being dizzy. Pt denies any pain at this time. Sitting in chair, pt is encouraged to drink additional water throughout the day and reason explained to patient. Patient verbalized understanding and all questions answered. ABC intact. No further needs at this time. Discharge from procedure area w/o issues.  

## 2022-10-07 ENCOUNTER — Telehealth: Payer: Self-pay

## 2022-10-07 NOTE — Telephone Encounter (Signed)
-----   Message from Ralene Bathe, MD sent at 10/07/2022 12:35 PM EST ----- Diagnosis Skin , left forehead 2cm above the brow BASAL CELL CARCINOMA WITH FOCAL SCLEROSIS, SEE DESCRIPTION  Cancer - BCC Already treated Recheck next visit

## 2022-10-07 NOTE — Telephone Encounter (Signed)
Patient and spouse advised of BX results. aw

## 2022-10-09 ENCOUNTER — Encounter: Payer: Self-pay | Admitting: Dermatology

## 2022-11-18 ENCOUNTER — Ambulatory Visit: Payer: Medicare Other | Attending: Neurology | Admitting: Physical Therapy

## 2022-11-18 DIAGNOSIS — R269 Unspecified abnormalities of gait and mobility: Secondary | ICD-10-CM | POA: Insufficient documentation

## 2022-11-18 DIAGNOSIS — R29898 Other symptoms and signs involving the musculoskeletal system: Secondary | ICD-10-CM | POA: Insufficient documentation

## 2022-11-18 DIAGNOSIS — R2689 Other abnormalities of gait and mobility: Secondary | ICD-10-CM | POA: Insufficient documentation

## 2022-11-18 NOTE — Therapy (Unsigned)
OUTPATIENT PHYSICAL THERAPY LOWER EXTREMITY EVALUATION   Patient Name: Brandon Barrett "Brandon Barrett" MRN: [id number] DOB:[birth year], 87 y.o., male Today's Date: 11/19/2022  END OF SESSION:  PT End of Session - 11/18/22 1033     Visit Number 1    Number of Visits 13    Date for PT Re-Evaluation 12/30/22    PT Start Time 1033    PT Stop Time 1118    PT Time Calculation (min) 45 min    Equipment Utilized During Treatment Gait belt    Activity Tolerance Patient tolerated treatment well    Behavior During Therapy [facility] for tasks assessed/performed             Past Medical History:  Diagnosis Date   Basal cell carcinoma 12/13/2013   left inferior forehead    Basal cell carcinoma 07/03/2014   left temple zygoma    Basal cell carcinoma 12/13/2013   left forehead    Basal cell carcinoma 01/21/2015   R mid back paraspinal    BCC (basal cell carcinoma) 09/28/2022   left forehead 2 cm above brow, tx'd with EDC   Squamous cell carcinoma of skin 11/24/2013   right cheek    Squamous cell carcinoma of skin 09/22/2007   L parietal scalp    Squamous cell carcinoma of skin 09/22/2021   L ear - ED&C   Past Surgical History:  Procedure Laterality Date   TEE WITHOUT CARDIOVERSION N/A 09/17/2022   Procedure: TRANSESOPHAGEAL ECHOCARDIOGRAM (TEE);  Surgeon: [name], MD;  Location: [facility] ORS;  Service: Cardiovascular;  Laterality: N/A;   There are no problems to display for this patient.   PCP: [name], MD    REFERRING PROVIDER: [name], MD   REFERRING DIAG: Other abnormalities of gait and mobility   THERAPY DIAG:  Gait difficulty  Weakness of both lower extremities  Balance disorder  Rationale for Evaluation and Treatment: Rehabilitation  ONSET DATE: 06/2022  SUBJECTIVE:   SUBJECTIVE STATEMENT: Pts. Wife reports pt. Is deaf in L ear and limited hearing in R ear. Pt. Presents to PT with balance issues. PT. Has had balance issues about 6  months. Pt. Has issues maintaining balance especially when bending, steps, and maintaining standing position.   PERTINENT HISTORY: Primary hypertension  Type 2 diabetes mellitus with hyperglycemia, without long-term current use of insulin (CMS-HCC)  Bilateral hearing loss, unspecified hearing loss type  Low vitamin B12 level  Hx. of seizures(controlled)  See MD notes for more.  PAIN:  Are you having pain? No  PRECAUTIONS: Fall  WEIGHT BEARING RESTRICTIONS: No  FALLS:  Has patient fallen in last 6 months? Yes. Number of falls 10+, no major injuries.   LIVING ENVIRONMENT: Lives with: lives with their family and lives with their spouse Lives in: House/apartment Stairs: Yes: Internal: 12 steps; on right going up and External: 3 steps; on right going up Has following equipment at home: Single point cane  OCCUPATION: Retired   PLOF: Independent with basic ADLs  PATIENT GOALS: Prevent falls, increase balance    NEXT MD VISIT: TBD  OBJECTIVE:   PATIENT SURVEYS:  FOTO 57/67  COGNITION: Overall cognitive status: Within functional limits for tasks assessed     SENSATION: WFL  MUSCLE LENGTH: Hamstrings: TBD Thomas test: TBD  POSTURE: No Significant postural limitations  LOWER EXTREMITY ROM:  Active ROM Right eval Left eval  Hip flexion WNL WNL  Hip extension    Hip abduction    Hip adduction WNL WNL  Hip internal rotation WNL WNL  Hip external rotation WNL WNL  Knee flexion WNL WNL  Knee extension WNL WNL  Ankle dorsiflexion WNL WNL  Ankle plantarflexion WNL WNL  Ankle inversion    Ankle eversion     (Blank rows = not tested)  LOWER EXTREMITY MMT:  MMT Right eval Left eval  Hip flexion 4-/5 4-/5  Hip extension    Hip abduction 4/5 4/5  Hip adduction    Hip internal rotation 5/5 5/5  Hip external rotation 5/5 5/5  Knee flexion 4/5 4/5  Knee extension 4/5 4/5  Ankle dorsiflexion 5/5 5/5  Ankle plantarflexion    Ankle inversion    Ankle eversion      (Blank rows = not tested)  FUNCTIONAL TESTS:  Berg Balance Scale: 47/56  GAIT: Distance walked: 60 ft. Assistive device utilized: None Level of assistance: Complete Independence Comments: Pt. Demonstrated decreased step cadence in order to maintain balance without an assistive device   TODAY'S TREATMENT:                                                                                                                              DATE: 11/18/2022   See evaluation/ issued HEP  PATIENT EDUCATION:  Education details: Pt. Educated on how weakness of LE muscles can effect balance and the importance of strengthening specific muscle groups. Pt. Educated on HEP and frequency of each exercise. Pt. Educated on length of recommended PT treatment for maximum Pt. Benefit. Pt. Educated on the benefits of balance training and the importance of balance to remain independent and prevent fall risk.  Person educated: Patient and Spouse Education method: Explanation, Demonstration, Verbal cues, and Handouts Education comprehension: verbalized understanding  HOME EXERCISE PROGRAM: Access Code: [email or web address] URL: [email or web address] Date: 11/18/2022 Prepared by: Dorcas Carrow Exercises - Standing with Head Rotation - 1 x daily - 7 x weekly - 3 sets - 10 reps - Standing March with Counter Support - 1 x daily - 7 x weekly - 3 sets - 10 reps - Standing Hip Abduction with Counter Support - 1 x daily - 7 x weekly - 2 sets - 10 reps   ASSESSMENT:  CLINICAL IMPRESSION: Patient is a pleasant 87 y.o. male who was seen today for physical therapy evaluation and treatment for balance and gait difficulty. Pt. Is accompanied to PT with his wife.  Pt. Is hard of hearing and requires many tactile cues and demonstrations. Pt. Has an extensive history of falls that have not resulted in any serious injury. Pt. Has increased difficulty with tasks involving reaching forward, bending down, turning around fast, or  maintaining balance once LE muscles become fatigued. Pt. Is motivated to remain independent and prevent any further falls from occurring. Pt. Is already an active individual and has good rehab potential with no serious co morbidities limiting his progression. Pt. Demonstrated B LE weakness that could be contributing to his increase in falls over the past 6  months. Pt. Had great mobility and was WNL in all LE categories. Pt. Would benefit from skilled physical therapy to help him remain independent in his ADL's and prevent any future falls/avoid serious injury.  OBJECTIVE IMPAIRMENTS: Abnormal gait, decreased balance, decreased coordination, decreased endurance, decreased knowledge of condition, difficulty walking, and decreased strength.   ACTIVITY LIMITATIONS: bending, standing, stairs, locomotion level, and maintaining balance.  PARTICIPATION LIMITATIONS: driving, community activity, and yard work  PERSONAL FACTORS: Age and Time since onset of injury/illness/exacerbation are also affecting patient's functional outcome.   REHAB POTENTIAL: Good  CLINICAL DECISION MAKING: Stable/uncomplicated  EVALUATION COMPLEXITY: Low   GOALS: Goals reviewed with patient? Yes  SHORT TERM GOALS: Target date: 12/09/2022 Pt. Independent with HEP to increase B hip strength 1/2 muscle grade to improve overall functional mobility. Baseline: 4/5 MMT (see above) Goal status: INITIAL   LONG TERM GOALS: Target date: 12/30/2022  Pt. Will increase FOTO to 67 to improve functional mobility and decrease fall risk.  Baseline: 57 Goal status: INITIAL  2.  Pt. Will increase BERG balance score to >51/56 to decrease fall risk and prevent major injury/hospital stays. Baseline: 47/56 Goal status: INITIAL  3.  Pt. Will ambulate with proper use of SPC with consistent 2-point gait pattern and no loss of balance on uneven/ grassy outdoor surfaces to improve independence in community.  Baseline: SPC Goal status:  INITIAL   PLAN:  PT FREQUENCY: 2x/week  PT DURATION: 6 weeks  PLANNED INTERVENTIONS: Therapeutic exercises, Therapeutic activity, Neuromuscular re-education, Balance training, Gait training, Patient/Family education, Self Care, Joint mobilization, Joint manipulation, Stair training, Cryotherapy, Moist heat, and Manual therapy  PLAN FOR NEXT SESSION: Start balance training, LE strengthening   Pura Spice, PT, DPT # [id number] Rod Holler, SPT 11/19/2022, 10:49 AM

## 2022-11-19 ENCOUNTER — Encounter: Payer: Self-pay | Admitting: Physical Therapy

## 2022-11-23 ENCOUNTER — Ambulatory Visit: Payer: Medicare Other | Admitting: Physical Therapy

## 2022-11-23 DIAGNOSIS — R2689 Other abnormalities of gait and mobility: Secondary | ICD-10-CM

## 2022-11-23 DIAGNOSIS — R269 Unspecified abnormalities of gait and mobility: Secondary | ICD-10-CM

## 2022-11-23 DIAGNOSIS — R29898 Other symptoms and signs involving the musculoskeletal system: Secondary | ICD-10-CM

## 2022-11-23 NOTE — Therapy (Signed)
OUTPATIENT PHYSICAL THERAPY LOWER EXTREMITY TREATMENT   Patient Name: Brandon Barrett [facility] "[name]" MRN: [id number] DOB:1935/11/11, 87 y.o., male Today's Date: 11/23/2022  END OF SESSION:  PT End of Session - 11/23/22 1249     Visit Number 2    Number of Visits 13    Date for PT Re-Evaluation 12/30/22    PT Start Time [id number]    PT Stop Time 1345    PT Time Calculation (min) 50 min    Equipment Utilized During Treatment Gait belt    Activity Tolerance Patient tolerated treatment well    Behavior During Therapy [facility] for tasks assessed/performed             Past Medical History:  Diagnosis Date   Basal cell carcinoma 12/13/2013   left inferior forehead    Basal cell carcinoma 07/03/2014   left temple zygoma    Basal cell carcinoma 12/13/2013   left forehead    Basal cell carcinoma 01/21/2015   R mid back paraspinal    BCC (basal cell carcinoma) 09/28/2022   left forehead 2 cm above brow, tx'd with EDC   Squamous cell carcinoma of skin 11/24/2013   right cheek    Squamous cell carcinoma of skin 09/22/2007   L parietal scalp    Squamous cell carcinoma of skin 09/22/2021   L ear - ED&C   Past Surgical History:  Procedure Laterality Date   TEE WITHOUT CARDIOVERSION N/A 09/17/2022   Procedure: TRANSESOPHAGEAL ECHOCARDIOGRAM (TEE);  Surgeon: [name], MD;  Location: [facility] ORS;  Service: Cardiovascular;  Laterality: N/A;   There are no problems to display for this patient.   PCP: [name], MD    REFERRING PROVIDER: [name], MD   REFERRING DIAG: Other abnormalities of gait and mobility   THERAPY DIAG:  Gait difficulty  Weakness of both lower extremities  Balance disorder  Rationale for Evaluation and Treatment: Rehabilitation  ONSET DATE: 06/2022  SUBJECTIVE:   SUBJECTIVE STATEMENT: Pts. Wife reports pt. Is deaf in L ear and limited hearing in R ear. Pt. Presents to PT with balance issues. PT. Has had balance issues about 6  months. Pt. Has issues maintaining balance especially when bending, steps, and maintaining standing position.   PERTINENT HISTORY: Primary hypertension  Type 2 diabetes mellitus with hyperglycemia, without long-term current use of insulin (CMS-HCC)  Bilateral hearing loss, unspecified hearing loss type  Low vitamin B12 level  Hx. of seizures(controlled)  See MD notes for more.  PAIN:  Are you having pain? No  PRECAUTIONS: Fall  WEIGHT BEARING RESTRICTIONS: No  FALLS:  Has patient fallen in last 6 months? Yes. Number of falls 10+, no major injuries.   LIVING ENVIRONMENT: Lives with: lives with their family and lives with their spouse Lives in: House/apartment Stairs: Yes: Internal: 12 steps; on right going up and External: 3 steps; on right going up Has following equipment at home: Single point cane  OCCUPATION: Retired   PLOF: Independent with basic ADLs  PATIENT GOALS: Prevent falls, increase balance    NEXT MD VISIT: TBD  OBJECTIVE:   PATIENT SURVEYS:  FOTO 57/67  COGNITION: Overall cognitive status: Within functional limits for tasks assessed     SENSATION: WFL  MUSCLE LENGTH: Hamstrings: TBD Thomas test: TBD  POSTURE: No Significant postural limitations  LOWER EXTREMITY ROM:  Active ROM Right eval Left eval  Hip flexion WNL WNL  Hip extension    Hip abduction    Hip adduction WNL WNL  Hip internal rotation WNL WNL  Hip external rotation WNL WNL  Knee flexion WNL WNL  Knee extension WNL WNL  Ankle dorsiflexion WNL WNL  Ankle plantarflexion WNL WNL  Ankle inversion    Ankle eversion     (Blank rows = not tested)  LOWER EXTREMITY MMT:  MMT Right eval Left eval  Hip flexion 4-/5 4-/5  Hip extension    Hip abduction 4/5 4/5  Hip adduction    Hip internal rotation 5/5 5/5  Hip external rotation 5/5 5/5  Knee flexion 4/5 4/5  Knee extension 4/5 4/5  Ankle dorsiflexion 5/5 5/5  Ankle plantarflexion    Ankle inversion    Ankle eversion      (Blank rows = not tested)  FUNCTIONAL TESTS:  Berg Balance Scale: 47/56  GAIT: Distance walked: 60 ft. Assistive device utilized: None Level of assistance: Complete Independence Comments: Pt. Demonstrated decreased step cadence in order to maintain balance without an assistive device   TODAY'S TREATMENT:                                                                                                                              DATE: 11/23/2022   Subjective: Pt. Arrived to PT without use of SPC. Pt. Reports he has not had any falls since the last visit. Pt. States he has trouble maintaining balance when walking straight and when standing up from being bent over while working on the [employer].   There. Ex: - NuStep seat 7. B UE/LE L3. 10 min.   Neuro. Mm:  -In //-bars:   [place]one tapping while standing on airex. 4x30 sec. bouts.   6" hurdle stepping. 5 laps. Pt. Cued to increase hip flexion and avoid hip circumduction.   Alternating Step ups/lateral steps ups onto 6" step with an airex on top to challenge dynamic tasks. 2x8 lateral step-ups. 2x10 step-ups.   -Hallway walking: Head turns, Head turns with reading letters/numbers aloud, increasing cadence. (2 laps each)  PATIENT EDUCATION:  Education details: Pt. Educated on how weakness of LE muscles can effect balance and the importance of strengthening specific muscle groups. Pt. Educated on HEP and frequency of each exercise. Pt. Educated on length of recommended PT treatment for maximum Pt. Benefit. Pt. Educated on the benefits of balance training and the importance of balance to remain independent and prevent fall risk.  Person educated: Patient and Spouse Education method: Explanation, Demonstration, Verbal cues, and Handouts Education comprehension: verbalized understanding  HOME EXERCISE PROGRAM: Access Code: [name] URL: [email or web address] Date: 11/18/2022 Prepared by: Brandon Barrett Exercises - Standing  with Head Rotation - 1 x daily - 7 x weekly - 3 sets - 10 reps - Standing March with Counter Support - 1 x daily - 7 x weekly - 3 sets - 10 reps - Standing Hip Abduction with Counter Support - 1 x daily - 7 x weekly - 2 sets - 10 reps   ASSESSMENT:  CLINICAL IMPRESSION:  Patient arrived to PT today without use of SPC/assistive device. Pt. Reported no pain and success with HEP. Pt. Is accompanied to PT with his wife due to having severe hearing loss. Pt. and pt.s wife educated on the benefits of a cane to decrease fall risk on uneven terrain. Pt. Reported no dizziness with NuStep. Pt. requires many tactile cues and demonstrations. Pt. Has increased difficulty with tasks involving reaching forward, bending down, turning around fast, or maintaining balance once LE muscles become fatigued. Pt. Is motivated to remain independent and prevent any further falls from occurring. Pt. Requires few rest breaks and shows good endurance. Pt. Demonstrated some shuffling/swaying during hallway gait and was cued by PT to increase hip flexion/dorsiflexion as well as increase his arm swing. PT noticed pt. Often reverts to a very narrow BOS that required frequent cuing to increase his BOS to decrease fall risk. Pt. Is already an active individual and has good rehab potential with no serious co morbidities limiting his progression. Pt. Demonstrated B LE weakness during there. Ex. Activities. Pt. Would benefit from skilled physical therapy to help him remain independent in his ADL's and prevent any future falls/avoid serious injury.  OBJECTIVE IMPAIRMENTS: Abnormal gait, decreased balance, decreased coordination, decreased endurance, decreased knowledge of condition, difficulty walking, and decreased strength.   ACTIVITY LIMITATIONS: bending, standing, stairs, locomotion level, and maintaining balance.  PARTICIPATION LIMITATIONS: driving, community activity, and yard work  PERSONAL FACTORS: Age and Time since onset of  injury/illness/exacerbation are also affecting patient's functional outcome.   REHAB POTENTIAL: Good  CLINICAL DECISION MAKING: Stable/uncomplicated  EVALUATION COMPLEXITY: Low   GOALS: Goals reviewed with patient? Yes  SHORT TERM GOALS: Target date: 12/09/2022 Pt. Independent with HEP to increase B hip strength 1/2 muscle grade to improve overall functional mobility. Baseline: 4/5 MMT (see above) Goal status: INITIAL   LONG TERM GOALS: Target date: 12/30/2022  Pt. Will increase FOTO to 67 to improve functional mobility and decrease fall risk.  Baseline: 57 Goal status: INITIAL  2.  Pt. Will increase BERG balance score to >51/56 to decrease fall risk and prevent major injury/hospital stays. Baseline: 47/56 Goal status: INITIAL  3.  Pt. Will ambulate with proper use of SPC with consistent 2-point gait pattern and no loss of balance on uneven/ grassy outdoor surfaces to improve independence in community.  Baseline: SPC Goal status: INITIAL   PLAN:  PT FREQUENCY: 2x/week  PT DURATION: 6 weeks  PLANNED INTERVENTIONS: Therapeutic exercises, Therapeutic activity, Neuromuscular re-education, Balance training, Gait training, Patient/Family education, Self Care, Joint mobilization, Joint manipulation, Stair training, Cryotherapy, Moist heat, and Manual therapy  PLAN FOR NEXT SESSION: Start balance training, LE strengthening   Pura Spice, PT, DPT # [id number] Brandon Barrett, SPT 11/23/2022, 3:51 PM

## 2022-11-25 ENCOUNTER — Ambulatory Visit: Payer: Medicare Other | Admitting: Physical Therapy

## 2022-11-25 DIAGNOSIS — R269 Unspecified abnormalities of gait and mobility: Secondary | ICD-10-CM | POA: Diagnosis not present

## 2022-11-25 DIAGNOSIS — R29898 Other symptoms and signs involving the musculoskeletal system: Secondary | ICD-10-CM

## 2022-11-25 DIAGNOSIS — R2689 Other abnormalities of gait and mobility: Secondary | ICD-10-CM

## 2022-11-25 NOTE — Therapy (Signed)
OUTPATIENT PHYSICAL THERAPY LOWER EXTREMITY TREATMENT   Patient Name: Brandon Barrett [facility] "Brandon Barrett" MRN: [id number] DOB:18-Apr-1936, 87 y.o., male Today's Date: 11/25/2022  END OF SESSION:  PT End of Session - 11/25/22 1019     Visit Number 3    Number of Visits 13    Date for PT Re-Evaluation 12/30/22    PT Start Time 1019    PT Stop Time 1112    PT Time Calculation (min) 53 min    Equipment Utilized During Treatment Gait belt    Activity Tolerance Patient tolerated treatment well    Behavior During Therapy WFL for tasks assessed/performed             Past Medical History:  Diagnosis Date   Basal cell carcinoma 12/13/2013   left inferior forehead    Basal cell carcinoma 07/03/2014   left temple zygoma    Basal cell carcinoma 12/13/2013   left forehead    Basal cell carcinoma 01/21/2015   R mid back paraspinal    BCC (basal cell carcinoma) 09/28/2022   left forehead 2 cm above brow, tx'd with EDC   Squamous cell carcinoma of skin 11/24/2013   right cheek    Squamous cell carcinoma of skin 09/22/2007   L parietal scalp    Squamous cell carcinoma of skin 09/22/2021   L ear - ED&C   Past Surgical History:  Procedure Laterality Date   TEE WITHOUT CARDIOVERSION N/A 09/17/2022   Procedure: TRANSESOPHAGEAL ECHOCARDIOGRAM (TEE);  Surgeon: [name], MD;  Location: [facility] ORS;  Service: Cardiovascular;  Laterality: N/A;   There are no problems to display for this patient.   PCP: [name], MD    REFERRING PROVIDER: [name], MD   REFERRING DIAG: Other abnormalities of gait and mobility   THERAPY DIAG:  Gait difficulty  Weakness of both lower extremities  Balance disorder  Rationale for Evaluation and Treatment: Rehabilitation  ONSET DATE: 06/2022  SUBJECTIVE:   SUBJECTIVE STATEMENT: Pts. Wife reports pt. Is deaf in L ear and limited hearing in R ear. Pt. Presents to PT with balance issues. PT. Has had balance issues about 6  months. Pt. Has issues maintaining balance especially when bending, steps, and maintaining standing position.   PERTINENT HISTORY: Primary hypertension  Type 2 diabetes mellitus with hyperglycemia, without long-term current use of insulin (CMS-HCC)  Bilateral hearing loss, unspecified hearing loss type  Low vitamin B12 level  Hx. of seizures(controlled)  See MD notes for more.  PAIN:  Are you having pain? No  PRECAUTIONS: Fall  WEIGHT BEARING RESTRICTIONS: No  FALLS:  Has patient fallen in last 6 months? Yes. Number of falls 10+, no major injuries.   LIVING ENVIRONMENT: Lives with: lives with their family and lives with their spouse Lives in: House/apartment Stairs: Yes: Internal: 12 steps; on right going up and External: 3 steps; on right going up Has following equipment at home: Single point cane  OCCUPATION: Retired   PLOF: Independent with basic ADLs  PATIENT GOALS: Prevent falls, increase balance    NEXT MD VISIT: TBD  OBJECTIVE:   PATIENT SURVEYS:  FOTO 57/67  COGNITION: Overall cognitive status: Within functional limits for tasks assessed     SENSATION: WFL  MUSCLE LENGTH: Hamstrings: TBD Thomas test: TBD  POSTURE: No Significant postural limitations  LOWER EXTREMITY ROM:  Active ROM Right eval Left eval  Hip flexion WNL WNL  Hip extension    Hip abduction    Hip adduction WNL WNL  Hip internal rotation WNL WNL  Hip external rotation WNL WNL  Knee flexion WNL WNL  Knee extension WNL WNL  Ankle dorsiflexion WNL WNL  Ankle plantarflexion WNL WNL  Ankle inversion    Ankle eversion     (Blank rows = not tested)  LOWER EXTREMITY MMT:  MMT Right eval Left eval  Hip flexion 4-/5 4-/5  Hip extension    Hip abduction 4/5 4/5  Hip adduction    Hip internal rotation 5/5 5/5  Hip external rotation 5/5 5/5  Knee flexion 4/5 4/5  Knee extension 4/5 4/5  Ankle dorsiflexion 5/5 5/5  Ankle plantarflexion    Ankle inversion    Ankle eversion      (Blank rows = not tested)  FUNCTIONAL TESTS:  Berg Balance Scale: 47/56  GAIT: Distance walked: 60 ft. Assistive device utilized: None Level of assistance: Complete Independence Comments: Pt. Demonstrated decreased step cadence in order to maintain balance without an assistive device   TODAY'S TREATMENT:                                                                                                                              DATE: 11/25/2022   Subjective: Pt. Arrived to PT without use of SPC. Pt. Reports he has not had any falls since the last visit. Pt. States he has trouble maintaining balance when he gets fatigued or does tasks that require bending or turning around quickly.   There. Ex: - NuStep seat 7. B UE/LE L3. 10 min.   -TRX squats 2x10 with chair behind pt. For safety and squat depth cue. Pt. Cued to maintain correct posture and keep an increased BOS.   -Hip abd./ext. with 3lb. Ankle Wt.'s outside //-bars for UE support. 2x10 each leg. PT. Cued to keep a stable pelvis and to not put any weight thru his arms and only use UE support for balance.   Neuro. Mm:  Obstacle course in hallway: (20 min.) Challenged pt.'s dynamic stability and balance with varying obstacles including: airex, cone taps, 6" hurdles, cone weaving/maneuvering, 6" step up. 3 laps. (Down/back)  PATIENT EDUCATION:  Education details: Pt. Educated on how weakness of LE muscles can effect balance and the importance of strengthening specific muscle groups. Pt. Educated on HEP and frequency of each exercise. Pt. Educated on length of recommended PT treatment for maximum Pt. Benefit. Pt. Educated on the benefits of balance training and the importance of balance to remain independent and prevent fall risk.  Person educated: Patient and Spouse Education method: Explanation, Demonstration, Verbal cues, and Handouts Education comprehension: verbalized understanding  HOME EXERCISE PROGRAM: Access Code:  [name] URL: [email or web address] Date: 11/18/2022 Prepared by: Dorcas Carrow Exercises - Standing with Head Rotation - 1 x daily - 7 x weekly - 3 sets - 10 reps - Standing March with Counter Support - 1 x daily - 7 x weekly - 3 sets - 10 reps - Standing Hip Abduction with Counter Support -  1 x daily - 7 x weekly - 2 sets - 10 reps   ASSESSMENT:  CLINICAL IMPRESSION:  Patient arrived to PT today without use of SPC/assistive device. Pt. Reported no pain and success with HEP. Pt. Is accompanied to PT with his wife due to having severe hearing loss. Pt. Reported no dizziness with NuStep. Pt. requires many tactile cues and demonstrations. Pt. Has increased difficulty with tasks involving reaching forward, bending down, turning around fast, or maintaining balance once LE muscles become fatigued. Pt. Is motivated to remain independent and prevent any further falls from occurring. Pt. Requires few rest breaks and shows good endurance. Pt. Demonstrated some LOB during dynamic balance tasks involving an uneven surface or one-legged stance. Pt. was cued by PT to increase hip flexion/dorsiflexion to help him fully clear the 6" hurdles. PT noticed pt. Often reverts to a very narrow BOS that required frequent cuing to increase his BOS to decrease fall risk. PT cued pt. To maintain wide BOS and to regain his balance before continuing with each step of the obstacle course. Pt. Required cues during there ex. To have correct posture and keep his hips from swaying to ensure targeting of hip abductors/glutes. Pt. Is already an active individual and has good rehab potential with no serious co morbidities limiting his progression. Pt. Demonstrated B LE weakness during there. Ex. Activities. Pt. Would benefit from skilled physical therapy to help him remain independent in his ADL's and prevent any future falls/avoid serious injury.  OBJECTIVE IMPAIRMENTS: Abnormal gait, decreased balance, decreased coordination,  decreased endurance, decreased knowledge of condition, difficulty walking, and decreased strength.   ACTIVITY LIMITATIONS: bending, standing, stairs, locomotion level, and maintaining balance.  PARTICIPATION LIMITATIONS: driving, community activity, and yard work  PERSONAL FACTORS: Age and Time since onset of injury/illness/exacerbation are also affecting patient's functional outcome.   REHAB POTENTIAL: Good  CLINICAL DECISION MAKING: Stable/uncomplicated  EVALUATION COMPLEXITY: Low   GOALS: Goals reviewed with patient? Yes  SHORT TERM GOALS: Target date: 12/09/2022 Pt. Independent with HEP to increase B hip strength 1/2 muscle grade to improve overall functional mobility. Baseline: 4/5 MMT (see above) Goal status: INITIAL   LONG TERM GOALS: Target date: 12/30/2022  Pt. Will increase FOTO to 67 to improve functional mobility and decrease fall risk.  Baseline: 57 Goal status: INITIAL  2.  Pt. Will increase BERG balance score to >51/56 to decrease fall risk and prevent major injury/hospital stays. Baseline: 47/56 Goal status: INITIAL  3.  Pt. Will ambulate with proper use of SPC with consistent 2-point gait pattern and no loss of balance on uneven/ grassy outdoor surfaces to improve independence in community.  Baseline: SPC Goal status: INITIAL   PLAN:  PT FREQUENCY: 2x/week  PT DURATION: 6 weeks  PLANNED INTERVENTIONS: Therapeutic exercises, Therapeutic activity, Neuromuscular re-education, Balance training, Gait training, Patient/Family education, Self Care, Joint mobilization, Joint manipulation, Stair training, Cryotherapy, Moist heat, and Manual therapy  PLAN FOR NEXT SESSION: Continue balance training, progress LE strengthening   Pura Spice, PT, DPT # [id number], SPT 11/25/2022, 1:36 PM

## 2022-11-30 ENCOUNTER — Ambulatory Visit: Payer: Medicare Other

## 2022-11-30 DIAGNOSIS — R2689 Other abnormalities of gait and mobility: Secondary | ICD-10-CM

## 2022-11-30 DIAGNOSIS — R269 Unspecified abnormalities of gait and mobility: Secondary | ICD-10-CM

## 2022-11-30 DIAGNOSIS — R29898 Other symptoms and signs involving the musculoskeletal system: Secondary | ICD-10-CM

## 2022-11-30 NOTE — Therapy (Unsigned)
OUTPATIENT PHYSICAL THERAPY LOWER EXTREMITY TREATMENT   Patient Name: Brandon Barrett [facility] "Brandon Barrett" MRN: [id number] DOB:05-12-1936, 87 y.o., male Today's Date: 12/01/2022  END OF SESSION:  PT End of Session - 11/30/22 0952     Visit Number 4    Number of Visits 13    Date for PT Re-Evaluation 12/30/22    PT Start Time 0949    PT Stop Time 1037    PT Time Calculation (min) 48 min    Equipment Utilized During Treatment Gait belt    Activity Tolerance Patient tolerated treatment well    Behavior During Therapy [facility] for tasks assessed/performed             Past Medical History:  Diagnosis Date   Basal cell carcinoma 12/13/2013   left inferior forehead    Basal cell carcinoma 07/03/2014   left temple zygoma    Basal cell carcinoma 12/13/2013   left forehead    Basal cell carcinoma 01/21/2015   R mid back paraspinal    BCC (basal cell carcinoma) 09/28/2022   left forehead 2 cm above brow, tx'd with EDC   Squamous cell carcinoma of skin 11/24/2013   right cheek    Squamous cell carcinoma of skin 09/22/2007   L parietal scalp    Squamous cell carcinoma of skin 09/22/2021   L ear - ED&C   Past Surgical History:  Procedure Laterality Date   TEE WITHOUT CARDIOVERSION N/A 09/17/2022   Procedure: TRANSESOPHAGEAL ECHOCARDIOGRAM (TEE);  Surgeon: [name], MD;  Location: [facility] ORS;  Service: Cardiovascular;  Laterality: N/A;   There are no problems to display for this patient.   PCP: [name], MD    REFERRING PROVIDER: [name], MD   REFERRING DIAG: Other abnormalities of gait and mobility   THERAPY DIAG:  Gait difficulty  Weakness of both lower extremities  Balance disorder  Rationale for Evaluation and Treatment: Rehabilitation  ONSET DATE: 06/2022  SUBJECTIVE:   SUBJECTIVE STATEMENT: Pts. Wife reports pt. Is deaf in L ear and limited hearing in R ear. Pt. Presents to PT with balance issues. PT. Has had balance issues about 6  months. Pt. Has issues maintaining balance especially when bending, steps, and maintaining standing position.   PERTINENT HISTORY: Primary hypertension  Type 2 diabetes mellitus with hyperglycemia, without long-term current use of insulin (CMS-HCC)  Bilateral hearing loss, unspecified hearing loss type  Low vitamin B12 level  Hx. of seizures(controlled)  See MD notes for more.  PAIN:  Are you having pain? No  PRECAUTIONS: Fall  WEIGHT BEARING RESTRICTIONS: No  FALLS:  Has patient fallen in last 6 months? Yes. Number of falls 10+, no major injuries.   LIVING ENVIRONMENT: Lives with: lives with their family and lives with their spouse Lives in: House/apartment Stairs: Yes: Internal: 12 steps; on right going up and External: 3 steps; on right going up Has following equipment at home: Single point cane  OCCUPATION: Retired   PLOF: Independent with basic ADLs  PATIENT GOALS: Prevent falls, increase balance    NEXT MD VISIT: TBD  OBJECTIVE:   PATIENT SURVEYS:  FOTO 57/67  COGNITION: Overall cognitive status: Within functional limits for tasks assessed     SENSATION: WFL  MUSCLE LENGTH: Hamstrings: TBD Thomas test: TBD  POSTURE: No Significant postural limitations  LOWER EXTREMITY ROM:  Active ROM Right eval Left eval  Hip flexion WNL WNL  Hip extension    Hip abduction    Hip adduction WNL WNL  Hip internal rotation WNL WNL  Hip external rotation WNL WNL  Knee flexion WNL WNL  Knee extension WNL WNL  Ankle dorsiflexion WNL WNL  Ankle plantarflexion WNL WNL  Ankle inversion    Ankle eversion     (Blank rows = not tested)  LOWER EXTREMITY MMT:  MMT Right eval Left eval  Hip flexion 4-/5 4-/5  Hip extension    Hip abduction 4/5 4/5  Hip adduction    Hip internal rotation 5/5 5/5  Hip external rotation 5/5 5/5  Knee flexion 4/5 4/5  Knee extension 4/5 4/5  Ankle dorsiflexion 5/5 5/5  Ankle plantarflexion    Ankle inversion    Ankle eversion      (Blank rows = not tested)  FUNCTIONAL TESTS:  Berg Balance Scale: 47/56  GAIT: Distance walked: 60 ft. Assistive device utilized: None Level of assistance: Complete Independence Comments: Pt. Demonstrated decreased step cadence in order to maintain balance without an assistive device   TODAY'S TREATMENT:                                                                                                                              DATE: 11/30/2022   Subjective: Pt. Arrived to PT without use of SPC. Pt. Reports he has not had any falls since the last visit. Pt. States he has seen an improvement in his balance since starting therapy. Pt. States he thinks more about an increased BOS during his ADL's. Pt.   There. Ex: - NuStep seat 7. B UE/LE L5. 6 min.   -Hip ext. with 4lb. Ankle Wt.'s in //-bars for UE support. 2x10 each leg. PT. Cued to keep a stable pelvis and to not put any weight thru his arms and only use UE support for balance.   -Standing hip flexion marches on airex to challenge dynamic stability in //-bars for UE support to correct any LOB. 2x10 each leg. Some instances of LOB that were corrected by pt. CGA via gait belt.   Neuro. Mm:  Obstacle course in hallway: (20 min.) Challenged pt.'s dynamic stability and balance with varying obstacles including: airex, cone taps, 6" hurdles, 6" step up. 3 laps. (Down/back) CGA via gait belt.   B Lateral step ups(6" step) onto airex in //-bars for UE support. 2x10 each side. CGA via gait belt.  Alternating LE cone taps. Cones on 6" step while pt. Is standing on airex. 2x 60 sec. Bouts. In //-bars for UE support in case of LOB. Pt. Required a couple corrections due to LOB. CGA via gait belt.   PATIENT EDUCATION:  Education details: Pt. Educated on how weakness of LE muscles can effect balance and the importance of strengthening specific muscle groups. Pt. Educated on HEP and frequency of each exercise. Pt. Educated on length of  recommended PT treatment for maximum Pt. Benefit. Pt. Educated on the benefits of balance training and the importance of balance to remain independent and prevent fall risk.  Person educated: Patient and Spouse Education method: Explanation, Demonstration, Verbal cues, and Handouts Education comprehension: verbalized understanding  HOME EXERCISE PROGRAM: Access Code: [name] URL: [email or web address] Date: 11/18/2022 Prepared by: Dorcas Carrow Exercises - Standing with Head Rotation - 1 x daily - 7 x weekly - 3 sets - 10 reps - Standing March with Counter Support - 1 x daily - 7 x weekly - 3 sets - 10 reps - Standing Hip Abduction with Counter Support - 1 x daily - 7 x weekly - 2 sets - 10 reps   ASSESSMENT:  CLINICAL IMPRESSION: Patient arrived to PT today without use of SPC/assistive device. Pt. Reported no pain and success with HEP. Pt. Is accompanied to PT with his wife due to having severe hearing loss. PT is led to believe there is a neuromuscular component occurring causing the patient's frequent LOB due to pt.'s hx. With right sided internal shingles causing an increase in R sided LE weakness. Pt. Demonstrated difficulty initiating movement of the R LE during there ex. Activity. Pt. Reported no dizziness with NuStep. Pt. requires many tactile cues and demonstrations. Pt. Has increased difficulty with tasks involving reaching forward, bending down, turning around fast, or maintaining balance once LE muscles become fatigued. Pt. Is motivated to remain independent and prevent any further falls from occurring. Pt. Requires few rest breaks and shows good endurance. Pt. Demonstrated some LOB during dynamic balance tasks involving an uneven surface or one-legged stance. Pt. was cued by PT to increase hip flexion/dorsiflexion to help him fully clear the 6" hurdles. PT noticed pt. Often reverts to a very narrow BOS that required frequent cuing to increase his BOS to decrease fall risk. PT  cued pt. To maintain wide BOS and to regain his balance before continuing with each step of the obstacle course. Pt. Required cues during there ex. To have correct posture and keep his hips from swaying to ensure targeting of glutes. Pt. Is already an active individual and has good rehab potential with no serious co morbidities limiting his progression. Pt. Demonstrated B LE weakness during there. Ex. Activities. Pt. Would benefit from skilled physical therapy to help him remain independent in his ADL's and prevent any future falls/avoid serious injury.  OBJECTIVE IMPAIRMENTS: Abnormal gait, decreased balance, decreased coordination, decreased endurance, decreased knowledge of condition, difficulty walking, and decreased strength.   ACTIVITY LIMITATIONS: bending, standing, stairs, locomotion level, and maintaining balance.  PARTICIPATION LIMITATIONS: driving, community activity, and yard work  PERSONAL FACTORS: Age and Time since onset of injury/illness/exacerbation are also affecting patient's functional outcome.   REHAB POTENTIAL: Good  CLINICAL DECISION MAKING: Stable/uncomplicated  EVALUATION COMPLEXITY: Low   GOALS: Goals reviewed with patient? Yes  SHORT TERM GOALS: Target date: 12/09/2022 Pt. Independent with HEP to increase B hip strength 1/2 muscle grade to improve overall functional mobility. Baseline: 4/5 MMT (see above) Goal status: INITIAL   LONG TERM GOALS: Target date: 12/30/2022  Pt. Will increase FOTO to 67 to improve functional mobility and decrease fall risk.  Baseline: 57 Goal status: INITIAL  2.  Pt. Will increase BERG balance score to >51/56 to decrease fall risk and prevent major injury/hospital stays. Baseline: 47/56 Goal status: INITIAL  3.  Pt. Will ambulate with proper use of SPC with consistent 2-point gait pattern and no loss of balance on uneven/ grassy outdoor surfaces to improve independence in community.  Baseline: SPC Goal status:  INITIAL   PLAN:  PT FREQUENCY: 2x/week  PT DURATION: 6 weeks  PLANNED INTERVENTIONS: Therapeutic  exercises, Therapeutic activity, Neuromuscular re-education, Balance training, Gait training, Patient/Family education, Self Care, Joint mobilization, Joint manipulation, Stair training, Cryotherapy, Moist heat, and Manual therapy  PLAN FOR NEXT SESSION: Continue neuromuscular balance training, progress LE strengthening  Joaquin Music PT DPT 9:32 AM,12/01/22  Rod Holler, SPT 12/01/2022, 9:32 AM

## 2022-12-02 ENCOUNTER — Ambulatory Visit: Payer: Medicare Other

## 2022-12-02 DIAGNOSIS — R269 Unspecified abnormalities of gait and mobility: Secondary | ICD-10-CM | POA: Diagnosis not present

## 2022-12-02 DIAGNOSIS — R29898 Other symptoms and signs involving the musculoskeletal system: Secondary | ICD-10-CM

## 2022-12-02 DIAGNOSIS — R2689 Other abnormalities of gait and mobility: Secondary | ICD-10-CM

## 2022-12-02 NOTE — Therapy (Cosign Needed)
OUTPATIENT PHYSICAL THERAPY LOWER EXTREMITY TREATMENT   Patient Name: Brandon Barrett [facility] "[name]" MRN: [id number] DOB:Apr 17, 1936, 87 y.o., male Today's Date: 12/02/2022  END OF SESSION:  PT End of Session - 12/02/22 0947     Visit Number 5    Number of Visits 13    Date for PT Re-Evaluation 12/30/22    PT Start Time 0947    PT Stop Time 1037    PT Time Calculation (min) 50 min    Equipment Utilized During Treatment Gait belt    Activity Tolerance Patient tolerated treatment well    Behavior During Therapy [facility] for tasks assessed/performed             Past Medical History:  Diagnosis Date   Basal cell carcinoma 12/13/2013   left inferior forehead    Basal cell carcinoma 07/03/2014   left temple zygoma    Basal cell carcinoma 12/13/2013   left forehead    Basal cell carcinoma 01/21/2015   R mid back paraspinal    BCC (basal cell carcinoma) 09/28/2022   left forehead 2 cm above brow, tx'd with EDC   Squamous cell carcinoma of skin 11/24/2013   right cheek    Squamous cell carcinoma of skin 09/22/2007   L parietal scalp    Squamous cell carcinoma of skin 09/22/2021   L ear - ED&C   Past Surgical History:  Procedure Laterality Date   TEE WITHOUT CARDIOVERSION N/A 09/17/2022   Procedure: TRANSESOPHAGEAL ECHOCARDIOGRAM (TEE);  Surgeon: [name], MD;  Location: [facility] ORS;  Service: Cardiovascular;  Laterality: N/A;   There are no problems to display for this patient.   PCP: [name], MD    REFERRING PROVIDER: [name], MD   REFERRING DIAG: Other abnormalities of gait and mobility   THERAPY DIAG:  Gait difficulty  Weakness of both lower extremities  Balance disorder  Rationale for Evaluation and Treatment: Rehabilitation  ONSET DATE: 06/2022  SUBJECTIVE:   SUBJECTIVE STATEMENT: Pts. Wife reports pt. Is deaf in L ear and limited hearing in R ear. Pt. Presents to PT with balance issues. PT. Has had balance issues about 6  months. Pt. Has issues maintaining balance especially when bending, steps, and maintaining standing position.   PERTINENT HISTORY: Primary hypertension  Type 2 diabetes mellitus with hyperglycemia, without long-term current use of insulin (CMS-HCC)  Bilateral hearing loss, unspecified hearing loss type  Low vitamin B12 level  Hx. of seizures(controlled)  See MD notes for more.  PAIN:  Are you having pain? No  PRECAUTIONS: Fall  WEIGHT BEARING RESTRICTIONS: No  FALLS:  Has patient fallen in last 6 months? Yes. Number of falls 10+, no major injuries.   LIVING ENVIRONMENT: Lives with: lives with their family and lives with their spouse Lives in: House/apartment Stairs: Yes: Internal: 12 steps; on right going up and External: 3 steps; on right going up Has following equipment at home: Single point cane  OCCUPATION: Retired   PLOF: Independent with basic ADLs  PATIENT GOALS: Prevent falls, increase balance    NEXT MD VISIT: TBD  OBJECTIVE:   PATIENT SURVEYS:  FOTO 57/67  COGNITION: Overall cognitive status: Within functional limits for tasks assessed     SENSATION: WFL  MUSCLE LENGTH: Hamstrings: TBD Thomas test: TBD  POSTURE: No Significant postural limitations  LOWER EXTREMITY ROM:  Active ROM Right eval Left eval  Hip flexion WNL WNL  Hip extension    Hip abduction    Hip adduction WNL WNL  Hip internal rotation WNL WNL  Hip external rotation WNL WNL  Knee flexion WNL WNL  Knee extension WNL WNL  Ankle dorsiflexion WNL WNL  Ankle plantarflexion WNL WNL  Ankle inversion    Ankle eversion     (Blank rows = not tested)  LOWER EXTREMITY MMT:  MMT Right eval Left eval  Hip flexion 4-/5 4-/5  Hip extension    Hip abduction 4/5 4/5  Hip adduction    Hip internal rotation 5/5 5/5  Hip external rotation 5/5 5/5  Knee flexion 4/5 4/5  Knee extension 4/5 4/5  Ankle dorsiflexion 5/5 5/5  Ankle plantarflexion    Ankle inversion    Ankle eversion      (Blank rows = not tested)  FUNCTIONAL TESTS:  Berg Balance Scale: 47/56  GAIT: Distance walked: 60 ft. Assistive device utilized: None Level of assistance: Complete Independence Comments: Pt. Demonstrated decreased step cadence in order to maintain balance without an assistive device   TODAY'S TREATMENT:                                                                                                                              DATE: 12/02/2022   Subjective: Pt. Arrived to PT without use of SPC. Pt. Reports he has not had any falls since the last visit. Pt. States he has seen an improvement in his balance since starting therapy. Pt. States he thinks more about an increased BOS during his ADL's.   There. Ex: - NuStep seat 7. B UE/LE L5. 10 min.   -Sit to stand from blue matt table for 1x30 sec. No LOB  -Sit to stand with semi tandem stance L foot in front of right. 1x30 sec. Add 3 lb. Wt. 1x30 sec.  -Sit to stand from blue matt table standing on airex pad. 1x30 sec. Add semi tandem stance with Left foot in front of right. 2x30 sec.  -Standing hip flexion marches on airex to challenge dynamic stability in //-bars for UE support to correct any LOB. 2x10 each leg. Some instances of LOB that were corrected by pt. CGA via gait belt.   Neuro. Mm:  -Obstacle course in hallway: (20 min.) Challenged pt.'s dynamic stability and balance with varying obstacles including: airex, cone taps, 6" hurdles, 6" step up. 3 laps. (Down/back) CGA via gait belt. 3 Laps.   -Bromberg semi-tandem with min. Assist with eyes closed 1x30 sec. and eyes open 1x30 sec. CGA.  PATIENT EDUCATION:  Education details: Pt. Educated on how weakness of LE muscles can effect balance and the importance of strengthening specific muscle groups. Pt. Educated on HEP and frequency of each exercise. Pt. Educated on length of recommended PT treatment for maximum Pt. Benefit. Pt. Educated on the benefits of balance training and  the importance of balance to remain independent and prevent fall risk.  Person educated: Patient and Spouse Education method: Explanation, Demonstration, Verbal cues, and Handouts Education comprehension: verbalized understanding  HOME  EXERCISE PROGRAM: Access Code: [email or web address] URL: [email or web address] Date: 11/18/2022 Prepared by: Dorcas Carrow Exercises - Standing with Head Rotation - 1 x daily - 7 x weekly - 3 sets - 10 reps - Standing March with Counter Support - 1 x daily - 7 x weekly - 3 sets - 10 reps - Standing Hip Abduction with Counter Support - 1 x daily - 7 x weekly - 2 sets - 10 reps   ASSESSMENT:  CLINICAL IMPRESSION:   Patient arrived to PT today without use of SPC/assistive device. Pt. Has no Reports of pain Pt. Is accompanied to PT with his wife due to having severe hearing loss. PT is led to believe there is a neuromuscular component occurring causing the patient's frequent LOB due to pt.'s hx. With right sided internal shingles causing an increase in R sided LE weakness. Pt. Demonstrated difficulty initiating movement of the R LE during there ex. Activity. Pt. Had increased difficulty when leading with the R LE or using the R LE as his main BOS. Pt. Reported no dizziness with NuStep. Pt. requires many tactile cues and demonstrations. Pt. Has increased difficulty with tasks involving reaching forward, bending down, turning around fast, or maintaining balance once LE muscles become fatigued. Pt. Is motivated to remain independent and prevent any further falls from occurring. Pt. Requires few rest breaks and shows good endurance. Pt. Demonstrated some LOB during dynamic balance tasks involving an uneven surface or one-legged stance. PT noticed pt. Often reverts to a very narrow BOS that required frequent cuing to increase his BOS to decrease fall risk. PT cued pt. To maintain wide BOS and to regain his balance before continuing with each step of the obstacle course. Pt. Is  already an active individual and has good rehab potential with no serious co morbidities limiting his progression. Pt. Demonstrated B LE weakness during there. Ex. Activities. Pt. Would benefit from skilled physical therapy to help him remain independent in his ADL's and prevent any future falls/avoid serious injury.  OBJECTIVE IMPAIRMENTS: Abnormal gait, decreased balance, decreased coordination, decreased endurance, decreased knowledge of condition, difficulty walking, and decreased strength.   ACTIVITY LIMITATIONS: bending, standing, stairs, locomotion level, and maintaining balance.  PARTICIPATION LIMITATIONS: driving, community activity, and yard work  PERSONAL FACTORS: Age and Time since onset of injury/illness/exacerbation are also affecting patient's functional outcome.   REHAB POTENTIAL: Good  CLINICAL DECISION MAKING: Stable/uncomplicated  EVALUATION COMPLEXITY: Low   GOALS: Goals reviewed with patient? Yes  SHORT TERM GOALS: Target date: 12/09/2022 Pt. Independent with HEP to increase B hip strength 1/2 muscle grade to improve overall functional mobility. Baseline: 4/5 MMT (see above) Goal status: INITIAL   LONG TERM GOALS: Target date: 12/30/2022  Pt. Will increase FOTO to 67 to improve functional mobility and decrease fall risk.  Baseline: 57 Goal status: INITIAL  2.  Pt. Will increase BERG balance score to >51/56 to decrease fall risk and prevent major injury/hospital stays. Baseline: 47/56 Goal status: INITIAL  3.  Pt. Will ambulate with proper use of SPC with consistent 2-point gait pattern and no loss of balance on uneven/ grassy outdoor surfaces to improve independence in community.  Baseline: SPC Goal status: INITIAL   PLAN:  PT FREQUENCY: 2x/week  PT DURATION: 6 weeks  PLANNED INTERVENTIONS: Therapeutic exercises, Therapeutic activity, Neuromuscular re-education, Balance training, Gait training, Patient/Family education, Self Care, Joint  mobilization, Joint manipulation, Stair training, Cryotherapy, Moist heat, and Manual therapy  PLAN FOR NEXT SESSION: Continue neuromuscular balance training, progress LE strengthening  Joaquin Music PT DPT 2:52 PM,12/02/22  Rod Holler, SPT 12/02/2022, 2:52 PM

## 2022-12-07 ENCOUNTER — Ambulatory Visit: Payer: Medicare Other | Admitting: Physical Therapy

## 2022-12-07 DIAGNOSIS — R269 Unspecified abnormalities of gait and mobility: Secondary | ICD-10-CM

## 2022-12-07 DIAGNOSIS — R29898 Other symptoms and signs involving the musculoskeletal system: Secondary | ICD-10-CM

## 2022-12-07 DIAGNOSIS — R2689 Other abnormalities of gait and mobility: Secondary | ICD-10-CM

## 2022-12-07 NOTE — Therapy (Unsigned)
OUTPATIENT PHYSICAL THERAPY LOWER EXTREMITY TREATMENT   Patient Name: Brandon Barrett [facility] "[name]" MRN: [id number] DOB:[birth year], 87 y.o., male Today's Date: 12/08/2022  END OF SESSION:  PT End of Session - 12/07/22 0948     Visit Number 6    Number of Visits 13    Date for PT Re-Evaluation 12/30/22    PT Start Time 0950    PT Stop Time 1033    PT Time Calculation (min) 43 min    Equipment Utilized During Treatment Gait belt    Activity Tolerance Patient tolerated treatment well    Behavior During Therapy [facility] for tasks assessed/performed             Past Medical History:  Diagnosis Date   Basal cell carcinoma 12/13/2013   left inferior forehead    Basal cell carcinoma 07/03/2014   left temple zygoma    Basal cell carcinoma 12/13/2013   left forehead    Basal cell carcinoma 01/21/2015   R mid back paraspinal    BCC (basal cell carcinoma) 09/28/2022   left forehead 2 cm above brow, tx'd with EDC   Squamous cell carcinoma of skin 11/24/2013   right cheek    Squamous cell carcinoma of skin 09/22/2007   L parietal scalp    Squamous cell carcinoma of skin 09/22/2021   L ear - ED&C   Past Surgical History:  Procedure Laterality Date   TEE WITHOUT CARDIOVERSION N/A 09/17/2022   Procedure: TRANSESOPHAGEAL ECHOCARDIOGRAM (TEE);  Surgeon: [name], MD;  Location: [facility] ORS;  Service: Cardiovascular;  Laterality: N/A;   There are no problems to display for this patient.   PCP: [name], MD    REFERRING PROVIDER: [name], MD   REFERRING DIAG: Other abnormalities of gait and mobility   THERAPY DIAG:  Gait difficulty  Weakness of both lower extremities  Balance disorder  Rationale for Evaluation and Treatment: Rehabilitation  ONSET DATE: 06/2022  SUBJECTIVE:   SUBJECTIVE STATEMENT: Pts. Wife reports pt. Is deaf in L ear and limited hearing in R ear. Pt. Presents to PT with balance issues. PT. Has had balance issues about 6  months. Pt. Has issues maintaining balance especially when bending, steps, and maintaining standing position.   PERTINENT HISTORY: Primary hypertension  Type 2 diabetes mellitus with hyperglycemia, without long-term current use of insulin (CMS-HCC)  Bilateral hearing loss, unspecified hearing loss type  Low vitamin B12 level  Hx. of seizures(controlled)  See MD notes for more.  PAIN:  Are you having pain? No  PRECAUTIONS: Fall  WEIGHT BEARING RESTRICTIONS: No  FALLS:  Has patient fallen in last 6 months? Yes. Number of falls 10+, no major injuries.   LIVING ENVIRONMENT: Lives with: lives with their family and lives with their spouse Lives in: House/apartment Stairs: Yes: Internal: 12 steps; on right going up and External: 3 steps; on right going up Has following equipment at home: Single point cane  OCCUPATION: Retired   PLOF: Independent with basic ADLs  PATIENT GOALS: Prevent falls, increase balance    NEXT MD VISIT: TBD  OBJECTIVE:   PATIENT SURVEYS:  FOTO 57/67  COGNITION: Overall cognitive status: Within functional limits for tasks assessed     SENSATION: WFL  MUSCLE LENGTH: Hamstrings: TBD Thomas test: TBD  POSTURE: No Significant postural limitations  LOWER EXTREMITY ROM:  Active ROM Right eval Left eval  Hip flexion WNL WNL  Hip extension    Hip abduction    Hip adduction WNL WNL  Hip internal rotation WNL WNL  Hip external rotation WNL WNL  Knee flexion WNL WNL  Knee extension WNL WNL  Ankle dorsiflexion WNL WNL  Ankle plantarflexion WNL WNL  Ankle inversion    Ankle eversion     (Blank rows = not tested)  LOWER EXTREMITY MMT:  MMT Right eval Left eval  Hip flexion 4-/5 4-/5  Hip extension    Hip abduction 4/5 4/5  Hip adduction    Hip internal rotation 5/5 5/5  Hip external rotation 5/5 5/5  Knee flexion 4/5 4/5  Knee extension 4/5 4/5  Ankle dorsiflexion 5/5 5/5  Ankle plantarflexion    Ankle inversion    Ankle eversion      (Blank rows = not tested)  FUNCTIONAL TESTS:  Berg Balance Scale: 47/56  GAIT: Distance walked: 60 ft. Assistive device utilized: None Level of assistance: Complete Independence Comments: Pt. Demonstrated decreased step cadence in order to maintain balance without an assistive device   TODAY'S TREATMENT:                                                                                                                              DATE: 12/07/2022   Subjective: Pt. Arrived to PT without use of SPC. Pt. Reports one fall since last visit outside due to stepping in a hole. Pt. Reports no injury from fall. PT encouraged pt. And wife to use SPC outside on uneven surfaces.   There. Ex:  - NuStep seat 7. B UE/LE L5. 10 min.   Neuro. Mm:  -Dynamic Agility ladder with alternating patterns to challenge different directions (frwd/bwkd/side-stepping). (3 laps x 2 patterns) Pt. Encouraged to increase step cadence during agility tasks to challenge balance.   -6" Hurdle step overs in hallway with pt. Cued to rotate head side to side and read letters off the wall to challenge multi-tasking while maintaining balance. (2x laps hurdles only. Pt. Cued to demonstrate normal gait) (3x laps with hurdles + reading letters) = 5 laps total  -6" lateral step ups onto airex to add a dynamic stability challenge. 1x15 each side. Pt. Had more difficulty leading with the L LE.   -Outside walking on varying terrain. (Mulch, grass, cracks, hills, roots, etc.) Few instances of dragging feet where PT encouraged heel strike and increased hip flexion to clear his feet during gait.   PATIENT EDUCATION:  Education details: Pt. Educated on how weakness of LE muscles can effect balance and the importance of strengthening specific muscle groups. Pt. Educated on HEP and frequency of each exercise. Pt. Educated on length of recommended PT treatment for maximum Pt. Benefit. Pt. Educated on the benefits of balance training and the  importance of balance to remain independent and prevent fall risk.  Person educated: Patient and Spouse Education method: Explanation, Demonstration, Verbal cues, and Handouts Education comprehension: verbalized understanding  HOME EXERCISE PROGRAM: Access Code: [email or web address] URL: [email or web address] Date: 11/18/2022 Prepared by: Dorcas Carrow Exercises - Standing with  Head Rotation - 1 x daily - 7 x weekly - 3 sets - 10 reps - Standing March with Counter Support - 1 x daily - 7 x weekly - 3 sets - 10 reps - Standing Hip Abduction with Counter Support - 1 x daily - 7 x weekly - 2 sets - 10 reps   ASSESSMENT:  CLINICAL IMPRESSION:   Patient arrived to PT today without use of SPC/assistive device. Pt. Has no Reports of pain. Pt. Is accompanied to PT with his wife due to having severe hearing loss. Pt. requires many tactile cues and demonstrations. Pt. Has increased difficulty maintaining balance once LE muscles become fatigued. Pt. Is motivated to remain independent and prevent any further falls from occurring. Pt. Requires few rest breaks and shows good endurance. PT added outside walking to today's treatment to mimic a more realistic balance task concordant with his every day life. Pt. Demonstrated moderate stability with the exception of occasional decreased foot dorsiflexion/hip flexion preventing him from completely clearing surfaces(especially raised ones with roots etc.). Pt. Had increased difficulty with clearing 6" hurdles when distracted via a multi-tasking component by being cued to read letters aloud from the wall. Pt. Demonstrated mild coordination deficits during agility ladder training that hindered his step-cadence. Pt. Demonstrated some LOB during dynamic balance tasks involving an uneven surface or one-legged stance. PT noticed pt. Often reverts to a very narrow BOS that required frequent cuing to increase his BOS to decrease fall risk. Pt. Is already an active individual and  has good rehab potential with no serious co morbidities limiting his progression. Pt. Would benefit from skilled physical therapy to help him remain independent in his ADL's and prevent any future falls/avoid serious injury.  OBJECTIVE IMPAIRMENTS: Abnormal gait, decreased balance, decreased coordination, decreased endurance, decreased knowledge of condition, difficulty walking, and decreased strength.   ACTIVITY LIMITATIONS: bending, standing, stairs, locomotion level, and maintaining balance.  PARTICIPATION LIMITATIONS: driving, community activity, and yard work  PERSONAL FACTORS: Age and Time since onset of injury/illness/exacerbation are also affecting patient's functional outcome.   REHAB POTENTIAL: Good  CLINICAL DECISION MAKING: Stable/uncomplicated  EVALUATION COMPLEXITY: Low   GOALS: Goals reviewed with patient? Yes  SHORT TERM GOALS: Target date: 12/09/2022 Pt. Independent with HEP to increase B hip strength 1/2 muscle grade to improve overall functional mobility. Baseline: 4/5 MMT (see above) Goal status: INITIAL   LONG TERM GOALS: Target date: 12/30/2022  Pt. Will increase FOTO to 67 to improve functional mobility and decrease fall risk.  Baseline: 57 Goal status: INITIAL  2.  Pt. Will increase BERG balance score to >51/56 to decrease fall risk and prevent major injury/hospital stays. Baseline: 47/56 Goal status: INITIAL  3.  Pt. Will ambulate with proper use of SPC with consistent 2-point gait pattern and no loss of balance on uneven/ grassy outdoor surfaces to improve independence in community.  Baseline: SPC Goal status: INITIAL   PLAN:  PT FREQUENCY: 2x/week  PT DURATION: 6 weeks  PLANNED INTERVENTIONS: Therapeutic exercises, Therapeutic activity, Neuromuscular re-education, Balance training, Gait training, Patient/Family education, Self Care, Joint mobilization, Joint manipulation, Stair training, Cryotherapy, Moist heat, and Manual therapy  PLAN FOR  NEXT SESSION: Continue neuromuscular balance training, progress LE strengthening.  Check STG   Rod Holler, SPT Pura Spice, PT, DPT # [id number] 12/08/2022, 7:53 AM

## 2022-12-08 ENCOUNTER — Encounter: Payer: Self-pay | Admitting: Physical Therapy

## 2022-12-09 ENCOUNTER — Ambulatory Visit: Payer: Medicare Other | Admitting: Physical Therapy

## 2022-12-09 DIAGNOSIS — R269 Unspecified abnormalities of gait and mobility: Secondary | ICD-10-CM

## 2022-12-09 DIAGNOSIS — R29898 Other symptoms and signs involving the musculoskeletal system: Secondary | ICD-10-CM

## 2022-12-09 DIAGNOSIS — R2689 Other abnormalities of gait and mobility: Secondary | ICD-10-CM

## 2022-12-09 NOTE — Therapy (Signed)
OUTPATIENT PHYSICAL THERAPY LOWER EXTREMITY TREATMENT   Patient Name: Brandon Bry [facility] "[name]" MRN: [id number] DOB:[birth year], 87 y.o., male Today's Date: 12/09/2022  END OF SESSION:  PT End of Session - 12/09/22 1032     Visit Number 7    Number of Visits 13    Date for PT Re-Evaluation 12/30/22    PT Start Time 1032    PT Stop Time 1117    PT Time Calculation (min) 45 min    Equipment Utilized During Treatment Gait belt    Activity Tolerance Patient tolerated treatment well    Behavior During Therapy [facility] for tasks assessed/performed             Past Medical History:  Diagnosis Date   Basal cell carcinoma 12/13/2013   left inferior forehead    Basal cell carcinoma 07/03/2014   left temple zygoma    Basal cell carcinoma 12/13/2013   left forehead    Basal cell carcinoma 01/21/2015   R mid back paraspinal    BCC (basal cell carcinoma) 09/28/2022   left forehead 2 cm above brow, tx'd with EDC   Squamous cell carcinoma of skin 11/24/2013   right cheek    Squamous cell carcinoma of skin 09/22/2007   L parietal scalp    Squamous cell carcinoma of skin 09/22/2021   L ear - ED&C   Past Surgical History:  Procedure Laterality Date   TEE WITHOUT CARDIOVERSION N/A 09/17/2022   Procedure: TRANSESOPHAGEAL ECHOCARDIOGRAM (TEE);  Surgeon: [name], MD;  Location: [facility] ORS;  Service: Cardiovascular;  Laterality: N/A;   There are no problems to display for this patient.   PCP: [name], MD    REFERRING PROVIDER: [name], MD   REFERRING DIAG: Other abnormalities of gait and mobility   THERAPY DIAG:  Gait difficulty  Weakness of both lower extremities  Balance disorder  Rationale for Evaluation and Treatment: Rehabilitation  ONSET DATE: 06/2022  SUBJECTIVE:   SUBJECTIVE STATEMENT: Pts. Wife reports pt. Is deaf in L ear and limited hearing in R ear. Pt. Presents to PT with balance issues. PT. Has had balance issues about 6  months. Pt. Has issues maintaining balance especially when bending, steps, and maintaining standing position.   PERTINENT HISTORY: Primary hypertension  Type 2 diabetes mellitus with hyperglycemia, without long-term current use of insulin (CMS-HCC)  Bilateral hearing loss, unspecified hearing loss type  Low vitamin B12 level  Hx. of seizures(controlled)  See MD notes for more.  PAIN:  Are you having pain? No  PRECAUTIONS: Fall  WEIGHT BEARING RESTRICTIONS: No  FALLS:  Has patient fallen in last 6 months? Yes. Number of falls 10+, no major injuries.   LIVING ENVIRONMENT: Lives with: lives with their family and lives with their spouse Lives in: House/apartment Stairs: Yes: Internal: 12 steps; on right going up and External: 3 steps; on right going up Has following equipment at home: Single point cane  OCCUPATION: Retired   PLOF: Independent with basic ADLs  PATIENT GOALS: Prevent falls, increase balance    NEXT MD VISIT: TBD  OBJECTIVE:   PATIENT SURVEYS:  FOTO 57/67  COGNITION: Overall cognitive status: Within functional limits for tasks assessed     SENSATION: WFL  MUSCLE LENGTH: Hamstrings: TBD Thomas test: TBD  POSTURE: No Significant postural limitations  LOWER EXTREMITY ROM:  Active ROM Right eval Left eval  Hip flexion WNL WNL  Hip extension    Hip abduction    Hip adduction WNL WNL  Hip internal rotation WNL WNL  Hip external rotation WNL WNL  Knee flexion WNL WNL  Knee extension WNL WNL  Ankle dorsiflexion WNL WNL  Ankle plantarflexion WNL WNL  Ankle inversion    Ankle eversion     (Blank rows = not tested)  LOWER EXTREMITY MMT:  MMT Right eval Left eval  Hip flexion 4-/5 4-/5  Hip extension    Hip abduction 4/5 4/5  Hip adduction    Hip internal rotation 5/5 5/5  Hip external rotation 5/5 5/5  Knee flexion 4/5 4/5  Knee extension 4/5 4/5  Ankle dorsiflexion 5/5 5/5  Ankle plantarflexion    Ankle inversion    Ankle eversion      (Blank rows = not tested)  FUNCTIONAL TESTS:  Berg Balance Scale: 47/56  GAIT: Distance walked: 60 ft. Assistive device utilized: None Level of assistance: Complete Independence Comments: Pt. Demonstrated decreased step cadence in order to maintain balance without an assistive device   TODAY'S TREATMENT:                                                                                                                              DATE: 12/09/2022   Subjective: Pt. Arrived to PT without use of SPC. Pt. Reports no falls since last PT visit. Pt. Reports trying to practice increased dorsiflexion to avoid dragging his feet. Pt. Reports being more aware of his BOS during outside activities to avoid LOB.   There. Ex:  - NuStep seat 7. B UE/LE L5. 12.5 min. .64 miles.   - One-Legged mini-squats 1x10 each side. In front of mirror for feedback on form. Pt. Had no LOB. Increased difficulty on R>L.   Neuro. Mm:  -Standing on vertical airex pad, pt. Performed Modified Tandem stance/Tandem stance 2x30 sec. Each position. Varying R/L foot as leading foot to challenge dynamic base of both LE's. PT. Used CGA/gait belt for safety.   -Cones on 6" step pt. standing on airex pad to challenge dynamic stability. 2x60 sec. taps each leg.   - Ball toss against re-bounder with 4 lb. ball. Standing on airex to challenge dynamic stability. Varying stance(shoulder width feet, modified tandem). Pt. Had occasional LOB that he self-corrected. Pt. Required light PT assist via gait belt in one instance of LOB. 1x15 tosses each position.   -Gait training in hallway. PT cued pt. To increase dorsiflexion of B feet to avoid dragging of feet and increasing fall risk. Pt. Cued to increase arm swing to promote balanced gait. 3 laps(down/back). Pt. Showed marked improvement in gait technique after slight adjsustment form PT.   PATIENT EDUCATION:  Education details: Pt. Educated on how weakness of LE muscles can effect  balance and the importance of strengthening specific muscle groups. Pt. Educated on HEP and frequency of each exercise. Pt. Educated on length of recommended PT treatment for maximum Pt. Benefit. Pt. Educated on the benefits of balance training and the importance of balance to remain  independent and prevent fall risk.  Person educated: Patient and Spouse Education method: Explanation, Demonstration, Verbal cues, and Handouts Education comprehension: verbalized understanding  HOME EXERCISE PROGRAM: Access Code: [name] URL: [email or web address] Date: 11/18/2022 Prepared by: Dorcas Carrow Exercises - Standing with Head Rotation - 1 x daily - 7 x weekly - 3 sets - 10 reps - Standing March with Counter Support - 1 x daily - 7 x weekly - 3 sets - 10 reps - Standing Hip Abduction with Counter Support - 1 x daily - 7 x weekly - 2 sets - 10 reps   ASSESSMENT:  CLINICAL IMPRESSION: Patient arrived to PT today without use of SPC/assistive device. Pt. Has no Reports of pain. Pt. Is accompanied to PT with his wife due to having severe hearing loss. Pt. requires many tactile cues and demonstrations. Pt. Has increased difficulty maintaining balance once LE muscles become fatigued. Pt. Is motivated to remain independent and prevent any further falls from occurring. Pt. Requires few rest breaks and shows good endurance. PT Used CGA/gait belt for safety throughout the duration of treatment. Pt. Demonstrates increased weakness on his R LE compared to his L. Pt. Was able to maintain tandem stance on airex with occasional LOB which he self corrected. Pt. Showed marked improvement in his gait specifically by demonstrating increased dorsiflexion and heel strike to avoid dragging of his feet. PT cued pt. To increase arm swing to promote a balanced/more efficient gait. Pt. Was challenged during dynamic stability tasks but only had occasional, self-corrected LOB. PT noticed pt. Often reverts to a very narrow BOS  that required frequent cuing to increase his BOS to decrease fall risk. Pt. Is already an active individual and has good rehab potential with no serious co morbidities limiting his progression. Pt. Would benefit from skilled physical therapy to help him remain independent in his ADL's and prevent any future falls/avoid serious injury.  OBJECTIVE IMPAIRMENTS: Abnormal gait, decreased balance, decreased coordination, decreased endurance, decreased knowledge of condition, difficulty walking, and decreased strength.   ACTIVITY LIMITATIONS: bending, standing, stairs, locomotion level, and maintaining balance.  PARTICIPATION LIMITATIONS: driving, community activity, and yard work  PERSONAL FACTORS: Age and Time since onset of injury/illness/exacerbation are also affecting patient's functional outcome.   REHAB POTENTIAL: Good  CLINICAL DECISION MAKING: Stable/uncomplicated  EVALUATION COMPLEXITY: Low   GOALS: Goals reviewed with patient? Yes  SHORT TERM GOALS: Target date: 12/09/2022 Pt. Independent with HEP to increase B hip strength 1/2 muscle grade to improve overall functional mobility. Baseline: 4/5 MMT (see above) Goal status: INITIAL   LONG TERM GOALS: Target date: 12/30/2022  Pt. Will increase FOTO to 67 to improve functional mobility and decrease fall risk.  Baseline: 57 Goal status: INITIAL  2.  Pt. Will increase BERG balance score to >51/56 to decrease fall risk and prevent major injury/hospital stays. Baseline: 47/56 Goal status: INITIAL  3.  Pt. Will ambulate with proper use of SPC with consistent 2-point gait pattern and no loss of balance on uneven/ grassy outdoor surfaces to improve independence in community.  Baseline: SPC Goal status: INITIAL   PLAN:  PT FREQUENCY: 2x/week  PT DURATION: 6 weeks  PLANNED INTERVENTIONS: Therapeutic exercises, Therapeutic activity, Neuromuscular re-education, Balance training, Gait training, Patient/Family education, Self Care,  Joint mobilization, Joint manipulation, Stair training, Cryotherapy, Moist heat, and Manual therapy  PLAN FOR NEXT SESSION: Continue neuromuscular balance training, progress LE strengthening.  Check STG   Brandon Barrett, SPT Brandon Barrett, PT, DPT # [phone] 12/09/2022, 12:43 PM

## 2022-12-14 ENCOUNTER — Encounter: Payer: Medicare Other | Admitting: Physical Therapy

## 2022-12-15 ENCOUNTER — Encounter: Payer: Self-pay | Admitting: Physical Therapy

## 2022-12-15 ENCOUNTER — Ambulatory Visit: Payer: Medicare Other | Attending: Neurology | Admitting: Physical Therapy

## 2022-12-15 DIAGNOSIS — R269 Unspecified abnormalities of gait and mobility: Secondary | ICD-10-CM | POA: Insufficient documentation

## 2022-12-15 DIAGNOSIS — R29898 Other symptoms and signs involving the musculoskeletal system: Secondary | ICD-10-CM

## 2022-12-15 DIAGNOSIS — R2689 Other abnormalities of gait and mobility: Secondary | ICD-10-CM | POA: Insufficient documentation

## 2022-12-15 NOTE — Therapy (Signed)
OUTPATIENT PHYSICAL THERAPY LOWER EXTREMITY TREATMENT   Patient Name: Samyog [facility] "[name]" MRN: [id number] DOB:14-Nov-1935, 87 y.o., male Today's Date: 12/15/2022  END OF SESSION:  PT End of Session - 12/15/22 1115     Visit Number 8    Number of Visits 13    Date for PT Re-Evaluation 12/30/22    PT Start Time 1115    PT Stop Time 1202    PT Time Calculation (min) 47 min    Equipment Utilized During Treatment Gait belt    Activity Tolerance Patient tolerated treatment well    Behavior During Therapy [facility] for tasks assessed/performed             Past Medical History:  Diagnosis Date   Basal cell carcinoma 12/13/2013   left inferior forehead    Basal cell carcinoma 07/03/2014   left temple zygoma    Basal cell carcinoma 12/13/2013   left forehead    Basal cell carcinoma 01/21/2015   R mid back paraspinal    BCC (basal cell carcinoma) 09/28/2022   left forehead 2 cm above brow, tx'd with EDC   Squamous cell carcinoma of skin 11/24/2013   right cheek    Squamous cell carcinoma of skin 09/22/2007   L parietal scalp    Squamous cell carcinoma of skin 09/22/2021   L ear - ED&C   Past Surgical History:  Procedure Laterality Date   TEE WITHOUT CARDIOVERSION N/A 09/17/2022   Procedure: TRANSESOPHAGEAL ECHOCARDIOGRAM (TEE);  Surgeon: [name], MD;  Location: [facility] ORS;  Service: Cardiovascular;  Laterality: N/A;   There are no problems to display for this patient.   PCP: [name], MD    REFERRING PROVIDER: [name], MD   REFERRING DIAG: Other abnormalities of gait and mobility   THERAPY DIAG:  Gait difficulty  Weakness of both lower extremities  Balance disorder  Rationale for Evaluation and Treatment: Rehabilitation  ONSET DATE: 06/2022  SUBJECTIVE:   SUBJECTIVE STATEMENT: Pts. Wife reports pt. Is deaf in L ear and limited hearing in R ear. Pt. Presents to PT with balance issues. PT. Has had balance issues about 6  months. Pt. Has issues maintaining balance especially when bending, steps, and maintaining standing position.   PERTINENT HISTORY: Primary hypertension  Type 2 diabetes mellitus with hyperglycemia, without long-term current use of insulin (CMS-HCC)  Bilateral hearing loss, unspecified hearing loss type  Low vitamin B12 level  Hx. of seizures(controlled)  See MD notes for more.  PAIN:  Are you having pain? No  PRECAUTIONS: Fall  WEIGHT BEARING RESTRICTIONS: No  FALLS:  Has patient fallen in last 6 months? Yes. Number of falls 10+, no major injuries.   LIVING ENVIRONMENT: Lives with: lives with their family and lives with their spouse Lives in: House/apartment Stairs: Yes: Internal: 12 steps; on right going up and External: 3 steps; on right going up Has following equipment at home: Single point cane  OCCUPATION: Retired   PLOF: Independent with basic ADLs  PATIENT GOALS: Prevent falls, increase balance    NEXT MD VISIT: TBD  OBJECTIVE:   PATIENT SURVEYS:  FOTO 57/67  COGNITION: Overall cognitive status: Within functional limits for tasks assessed     SENSATION: WFL  MUSCLE LENGTH: Hamstrings: TBD Thomas test: TBD  POSTURE: No Significant postural limitations  LOWER EXTREMITY ROM:  Active ROM Right eval Left eval  Hip flexion WNL WNL  Hip extension    Hip abduction    Hip adduction WNL WNL  Hip internal rotation WNL WNL  Hip external rotation WNL WNL  Knee flexion WNL WNL  Knee extension WNL WNL  Ankle dorsiflexion WNL WNL  Ankle plantarflexion WNL WNL  Ankle inversion    Ankle eversion     (Blank rows = not tested)  LOWER EXTREMITY MMT:  MMT Right eval Left eval  Hip flexion 4-/5 4-/5  Hip extension    Hip abduction 4/5 4/5  Hip adduction    Hip internal rotation 5/5 5/5  Hip external rotation 5/5 5/5  Knee flexion 4/5 4/5  Knee extension 4/5 4/5  Ankle dorsiflexion 5/5 5/5  Ankle plantarflexion    Ankle inversion    Ankle eversion      (Blank rows = not tested)  FUNCTIONAL TESTS:  Berg Balance Scale: 47/56  GAIT: Distance walked: 60 ft. Assistive device utilized: None Level of assistance: Complete Independence Comments: Pt. Demonstrated decreased step cadence in order to maintain balance without an assistive device   TODAY'S TREATMENT:                                                                                                                              DATE: 12/15/2022   Subjective: Pt. Arrived to PT without use of SPC. Pt. Reports no falls since last PT visit.  Pt. Hit head on mower and has band aid on top of head.    There. Ex:  No charge - NuStep seat 7 with B UE/LE L5. 12 min. 0.63 miles.   Neuro. Mm:  - Walking in clinic/ 6" step ups with minimal to no UE assist.  Cuing to maintain proper BOS.    - Walking cone taps at agility ladder with SBA/CGA for safety and cuing.  1 lap with 2 mistakes on L LE taps.  Pt. States he does not like cone activities.    -Standing on vertical airex pad, pt. Performed Modified Tandem stance/Tandem stance 2x30 sec. Each position. Varying R/L foot as leading foot to challenge dynamic base of both LE's. PT. Used CGA/gait belt for safety.  Added EC to task with NBOS.    -Cones on 6" step pt. standing on airex pad to challenge dynamic stability. 2x60 sec. taps each leg.   - Ball toss against re-bounder with 4 lb. ball. Standing on airex to challenge dynamic stability. Varying stance(shoulder width feet, modified tandem). Pt. Had occasional LOB that he self-corrected. Pt. Required light PT assist via gait belt in one instance of LOB. 1x15 tosses each position.   - Alt. UE/LE touches in //-bars and clinic with extra time to understand task/ proprioception of crossing arms.  No LOB.    - Walking in hallway and outside with use of gait belt.  PT cued pt. To increase dorsiflexion of B feet to avoid dragging of feet and increasing fall risk. Pt. Cued to increase arm swing to  promote balanced gait. Walking on grassy terrain/ up and down curbs with no LOB.  PATIENT EDUCATION:  Education details: Pt. Educated on how weakness of LE muscles can effect balance and the importance of strengthening specific muscle groups. Pt. Educated on HEP and frequency of each exercise. Pt. Educated on length of recommended PT treatment for maximum Pt. Benefit. Pt. Educated on the benefits of balance training and the importance of balance to remain independent and prevent fall risk.  Person educated: Patient and Spouse Education method: Explanation, Demonstration, Verbal cues, and Handouts Education comprehension: verbalized understanding  HOME EXERCISE PROGRAM: Access Code: [email or web address] URL: [email or web address] Date: 11/18/2022 Prepared by: Dorcas Carrow Exercises - Standing with Head Rotation - 1 x daily - 7 x weekly - 3 sets - 10 reps - Standing March with Counter Support - 1 x daily - 7 x weekly - 3 sets - 10 reps - Standing Hip Abduction with Counter Support - 1 x daily - 7 x weekly - 2 sets - 10 reps   ASSESSMENT:  CLINICAL IMPRESSION: Patient arrived to PT today without use of SPC/assistive device. Pt. Has no Reports of pain. Pt. Is accompanied to PT with his wife due to having severe hearing loss. Pt. requires many tactile cues and demonstrations. Pt. Has increased difficulty maintaining balance once LE muscles become fatigued. Pt. Is motivated to remain independent and prevent any further falls from occurring. Pt. Requires few rest breaks and shows good endurance. PT Used CGA/gait belt for safety throughout the duration of treatment. Pt. Demonstrates increased weakness on his R LE compared to his L. Pt. Was able to maintain tandem stance on airex with occasional LOB which he self corrected. Pt. Showed marked improvement in his gait specifically by demonstrating increased dorsiflexion and heel strike to avoid dragging of his feet. PT cued pt. To increase arm swing to  promote a balanced/more efficient gait.  PT noticed pt. Often reverts to a very narrow BOS that required frequent cuing to increase his BOS to decrease fall risk. Pt. Is already an active individual and has good rehab potential with no serious co morbidities limiting his progression. Pt. Would benefit from skilled physical therapy to help him remain independent in his ADL's and prevent any future falls/avoid serious injury.  OBJECTIVE IMPAIRMENTS: Abnormal gait, decreased balance, decreased coordination, decreased endurance, decreased knowledge of condition, difficulty walking, and decreased strength.   ACTIVITY LIMITATIONS: bending, standing, stairs, locomotion level, and maintaining balance.  PARTICIPATION LIMITATIONS: driving, community activity, and yard work  PERSONAL FACTORS: Age and Time since onset of injury/illness/exacerbation are also affecting patient's functional outcome.   REHAB POTENTIAL: Good  CLINICAL DECISION MAKING: Stable/uncomplicated  EVALUATION COMPLEXITY: Low   GOALS: Goals reviewed with patient? Yes  SHORT TERM GOALS: Target date: 12/09/2022 Pt. Independent with HEP to increase B hip strength 1/2 muscle grade to improve overall functional mobility. Baseline: 4/5 MMT (see above) Goal status: Partially met   LONG TERM GOALS: Target date: 12/30/2022  Pt. Will increase FOTO to 67 to improve functional mobility and decrease fall risk.  Baseline: 57 Goal status: INITIAL  2.  Pt. Will increase BERG balance score to >51/56 to decrease fall risk and prevent major injury/hospital stays. Baseline: 47/56 Goal status: INITIAL  3.  Pt. Will ambulate with proper use of SPC with consistent 2-point gait pattern and no loss of balance on uneven/ grassy outdoor surfaces to improve independence in community.  Baseline: SPC Goal status: INITIAL   PLAN:  PT FREQUENCY: 2x/week  PT DURATION: 6 weeks  PLANNED INTERVENTIONS: Therapeutic exercises, Therapeutic activity,  Neuromuscular re-education, Balance training,  Gait training, Patient/Family education, Self Care, Joint mobilization, Joint manipulation, Stair training, Cryotherapy, Moist heat, and Manual therapy  PLAN FOR NEXT SESSION: Continue neuromuscular balance training, progress LE strengthening.  Berg balance test  Pura Spice, PT, DPT # [phone] 12/15/2022, 12:27 PM

## 2022-12-16 ENCOUNTER — Encounter: Payer: Medicare Other | Admitting: Physical Therapy

## 2022-12-17 ENCOUNTER — Ambulatory Visit: Payer: Medicare Other | Admitting: Physical Therapy

## 2022-12-17 ENCOUNTER — Encounter: Payer: Self-pay | Admitting: Physical Therapy

## 2022-12-17 DIAGNOSIS — R269 Unspecified abnormalities of gait and mobility: Secondary | ICD-10-CM | POA: Diagnosis not present

## 2022-12-17 DIAGNOSIS — R29898 Other symptoms and signs involving the musculoskeletal system: Secondary | ICD-10-CM

## 2022-12-17 DIAGNOSIS — R2689 Other abnormalities of gait and mobility: Secondary | ICD-10-CM

## 2022-12-17 NOTE — Therapy (Signed)
OUTPATIENT PHYSICAL THERAPY LOWER EXTREMITY TREATMENT   Patient Name: Myshaun Holdt [facility] "[name]" MRN: [id number] DOB:1936/09/08, 87 y.o., male Today's Date: 12/17/2022  END OF SESSION:  PT End of Session - 12/17/22 1116     Visit Number 9    Number of Visits 13    Date for PT Re-Evaluation 12/30/22    PT Start Time 1111    Equipment Utilized During Treatment Gait belt    Activity Tolerance Patient tolerated treatment well    Behavior During Therapy [facility] for tasks assessed/performed            [id number] to 1156  (45 minutes)  Past Medical History:  Diagnosis Date   Basal cell carcinoma 12/13/2013   left inferior forehead    Basal cell carcinoma 07/03/2014   left temple zygoma    Basal cell carcinoma 12/13/2013   left forehead    Basal cell carcinoma 01/21/2015   R mid back paraspinal    BCC (basal cell carcinoma) 09/28/2022   left forehead 2 cm above brow, tx'd with EDC   Squamous cell carcinoma of skin 11/24/2013   right cheek    Squamous cell carcinoma of skin 09/22/2007   L parietal scalp    Squamous cell carcinoma of skin 09/22/2021   L ear - ED&C   Past Surgical History:  Procedure Laterality Date   TEE WITHOUT CARDIOVERSION N/A 09/17/2022   Procedure: TRANSESOPHAGEAL ECHOCARDIOGRAM (TEE);  Surgeon: [name], MD;  Location: [facility] ORS;  Service: Cardiovascular;  Laterality: N/A;   There are no problems to display for this patient.   PCP: [name], MD    REFERRING PROVIDER: [name], MD   REFERRING DIAG: Other abnormalities of gait and mobility   THERAPY DIAG:  Gait difficulty  Weakness of both lower extremities  Balance disorder  Rationale for Evaluation and Treatment: Rehabilitation  ONSET DATE: 06/2022  SUBJECTIVE:   SUBJECTIVE STATEMENT: Pts. Wife reports pt. Is deaf in L ear and limited hearing in R ear. Pt. Presents to PT with balance issues. PT. Has had balance issues about 6 months. Pt. Has issues maintaining  balance especially when bending, steps, and maintaining standing position.   PERTINENT HISTORY: Primary hypertension  Type 2 diabetes mellitus with hyperglycemia, without long-term current use of insulin (CMS-HCC)  Bilateral hearing loss, unspecified hearing loss type  Low vitamin B12 level  Hx. of seizures(controlled)  See MD notes for more.  PAIN:  Are you having pain? No  PRECAUTIONS: Fall  WEIGHT BEARING RESTRICTIONS: No  FALLS:  Has patient fallen in last 6 months? Yes. Number of falls 10+, no major injuries.   LIVING ENVIRONMENT: Lives with: lives with their family and lives with their spouse Lives in: House/apartment Stairs: Yes: Internal: 12 steps; on right going up and External: 3 steps; on right going up Has following equipment at home: Single point cane  OCCUPATION: Retired   PLOF: Independent with basic ADLs  PATIENT GOALS: Prevent falls, increase balance    NEXT MD VISIT: TBD  OBJECTIVE:   PATIENT SURVEYS:  FOTO 57/67  COGNITION: Overall cognitive status: Within functional limits for tasks assessed     SENSATION: WFL  MUSCLE LENGTH: Hamstrings: TBD Thomas test: TBD  POSTURE: No Significant postural limitations  LOWER EXTREMITY ROM:  Active ROM Right eval Left eval  Hip flexion WNL WNL  Hip extension    Hip abduction    Hip adduction WNL WNL  Hip internal rotation WNL WNL  Hip external rotation  WNL WNL  Knee flexion WNL WNL  Knee extension WNL WNL  Ankle dorsiflexion WNL WNL  Ankle plantarflexion WNL WNL  Ankle inversion    Ankle eversion     (Blank rows = not tested)  LOWER EXTREMITY MMT:  MMT Right eval Left eval  Hip flexion 4-/5 4-/5  Hip extension    Hip abduction 4/5 4/5  Hip adduction    Hip internal rotation 5/5 5/5  Hip external rotation 5/5 5/5  Knee flexion 4/5 4/5  Knee extension 4/5 4/5  Ankle dorsiflexion 5/5 5/5  Ankle plantarflexion    Ankle inversion    Ankle eversion     (Blank rows = not  tested)  FUNCTIONAL TESTS:  Berg Balance Scale: 47/56  GAIT: Distance walked: 60 ft. Assistive device utilized: None Level of assistance: Complete Independence Comments: Pt. Demonstrated decreased step cadence in order to maintain balance without an assistive device   TODAY'S TREATMENT:                                                                                                                              DATE: 12/17/2022   Subjective: Pt. Arrived to PT without use of SPC. Pt. Reports no falls since last PT visit.  No new complaints.     There. Ex:  No charge - NuStep seat 7 with B UE/LE L5. 12 min. 0.63 miles.  Warm-up before balance tasks.   Neuro. Mm:  - Walking in clinic/ 6" step ups with minimal to no UE assist.  Cuing to maintain proper BOS.    -Rockerboard in //-bars: forward/backwards with light to no UE assist.  Pt. Challenged and improved at end of tx. Reassessment.   -Standing on vertical airex pad, pt. Performed Modified Tandem stance/Tandem stance 2x30 sec. Each position. Varying R/L foot as leading foot to challenge dynamic base of both LE's. PT. Used CGA/gait belt for safety.  Added EC to task with NBOS.    - Alt. UE/LE touches in //-bars and clinic with extra time to understand task/ proprioception of crossing arms.  No LOB.    - Walking in hallway and outside (hills/ root area around tree/ hill/ stairs) with use of gait belt.  PT cued pt. To increase dorsiflexion of B feet to avoid dragging of feet and increasing fall risk. Pt. Cued to increase arm swing to promote balanced gait. Walking on grassy terrain/ up and down curbs with no LOB.    PATIENT EDUCATION:  Education details: Pt. Educated on how weakness of LE muscles can effect balance and the importance of strengthening specific muscle groups. Pt. Educated on HEP and frequency of each exercise. Pt. Educated on length of recommended PT treatment for maximum Pt. Benefit. Pt. Educated on the benefits of balance  training and the importance of balance to remain independent and prevent fall risk.  Person educated: Patient and Spouse Education method: Explanation, Demonstration, Verbal cues, and Handouts Education comprehension: verbalized understanding  HOME EXERCISE PROGRAM: Access Code: [name] URL: [email or web address] Date: 11/18/2022 Prepared by: Dorcas Carrow Exercises - Standing with Head Rotation - 1 x daily - 7 x weekly - 3 sets - 10 reps - Standing March with Counter Support - 1 x daily - 7 x weekly - 3 sets - 10 reps - Standing Hip Abduction with Counter Support - 1 x daily - 7 x weekly - 2 sets - 10 reps   ASSESSMENT:  CLINICAL IMPRESSION: Patient arrived to PT today without use of SPC/assistive device. Pt. Has no Reports of pain. Pt. Is accompanied to PT with his wife due to having severe hearing loss. Pt. requires many tactile cues and demonstrations, esp. With alt. UE/LE touches. Pt. Has increased difficulty maintaining balance once LE muscles become fatigued. Pt. Is motivated to remain independent and prevent any further falls from occurring. Pt. Requires few rest breaks and shows good endurance. PT Used CGA/gait belt for safety throughout the duration of treatment. Pt. Demonstrates increased weakness on his R LE compared to his L. Pt. Was able to maintain tandem stance on airex with occasional LOB which he self corrected. Pt. Showed marked improvement in his gait specifically by demonstrating increased dorsiflexion and heel strike to avoid dragging of his feet. PT cued pt. To increase arm swing to promote a balanced/more efficient gait.  PT noticed pt. Often reverts to a very narrow BOS that required frequent cuing to increase his BOS to decrease fall risk. Pt. Is already an active individual and has good rehab potential with no serious co morbidities limiting his progression. Pt. Would benefit from skilled physical therapy to help him remain independent in his ADL's and prevent any  future falls/avoid serious injury.  OBJECTIVE IMPAIRMENTS: Abnormal gait, decreased balance, decreased coordination, decreased endurance, decreased knowledge of condition, difficulty walking, and decreased strength.   ACTIVITY LIMITATIONS: bending, standing, stairs, locomotion level, and maintaining balance.  PARTICIPATION LIMITATIONS: driving, community activity, and yard work  PERSONAL FACTORS: Age and Time since onset of injury/illness/exacerbation are also affecting patient's functional outcome.   REHAB POTENTIAL: Good  CLINICAL DECISION MAKING: Stable/uncomplicated  EVALUATION COMPLEXITY: Low   GOALS: Goals reviewed with patient? Yes  SHORT TERM GOALS: Target date: 12/09/2022 Pt. Independent with HEP to increase B hip strength 1/2 muscle grade to improve overall functional mobility. Baseline: 4/5 MMT (see above) Goal status: Partially met   LONG TERM GOALS: Target date: 12/30/2022  Pt. Will increase FOTO to 67 to improve functional mobility and decrease fall risk.  Baseline: 57 Goal status: INITIAL  2.  Pt. Will increase BERG balance score to >51/56 to decrease fall risk and prevent major injury/hospital stays. Baseline: 47/56 Goal status: INITIAL  3.  Pt. Will ambulate with proper use of SPC with consistent 2-point gait pattern and no loss of balance on uneven/ grassy outdoor surfaces to improve independence in community.  Baseline: SPC Goal status: INITIAL   PLAN:  PT FREQUENCY: 2x/week  PT DURATION: 6 weeks  PLANNED INTERVENTIONS: Therapeutic exercises, Therapeutic activity, Neuromuscular re-education, Balance training, Gait training, Patient/Family education, Self Care, Joint mobilization, Joint manipulation, Stair training, Cryotherapy, Moist heat, and Manual therapy  PLAN FOR NEXT SESSION: Continue neuromuscular balance training, progress LE strengthening.  Berg balance test  Pura Spice, PT, DPT # [phone] 12/17/2022, 11:16 AM

## 2022-12-21 ENCOUNTER — Encounter: Payer: Self-pay | Admitting: Physical Therapy

## 2022-12-21 ENCOUNTER — Ambulatory Visit: Payer: Medicare Other | Admitting: Physical Therapy

## 2022-12-21 DIAGNOSIS — R29898 Other symptoms and signs involving the musculoskeletal system: Secondary | ICD-10-CM

## 2022-12-21 DIAGNOSIS — R269 Unspecified abnormalities of gait and mobility: Secondary | ICD-10-CM

## 2022-12-21 DIAGNOSIS — R2689 Other abnormalities of gait and mobility: Secondary | ICD-10-CM

## 2022-12-21 NOTE — Therapy (Signed)
OUTPATIENT PHYSICAL THERAPY LOWER EXTREMITY TREATMENT Physical Therapy Progress Note  Dates of reporting period  11/18/22  to  12/21/22   Patient Name: [facility] "[name]" MRN: [id number] DOB:[birth year], 87 y.o., male Today's Date: 12/21/2022  END OF SESSION:  PT End of Session - 12/21/22 0941     Visit Number 10    Number of Visits 13    Date for PT Re-Evaluation 12/30/22    PT Start Time 0941    PT Stop Time 1033    PT Time Calculation (min) 52 min    Equipment Utilized During Treatment Gait belt    Activity Tolerance Patient tolerated treatment well    Behavior During Therapy [facility] for tasks assessed/performed             Past Medical History:  Diagnosis Date   Basal cell carcinoma 12/13/2013   left inferior forehead    Basal cell carcinoma 07/03/2014   left temple zygoma    Basal cell carcinoma 12/13/2013   left forehead    Basal cell carcinoma 01/21/2015   R mid back paraspinal    BCC (basal cell carcinoma) 09/28/2022   left forehead 2 cm above brow, tx'd with EDC   Squamous cell carcinoma of skin 11/24/2013   right cheek    Squamous cell carcinoma of skin 09/22/2007   L parietal scalp    Squamous cell carcinoma of skin 09/22/2021   L ear - ED&C   Past Surgical History:  Procedure Laterality Date   TEE WITHOUT CARDIOVERSION N/A 09/17/2022   Procedure: TRANSESOPHAGEAL ECHOCARDIOGRAM (TEE);  Surgeon: [name], MD;  Location: [facility] ORS;  Service: Cardiovascular;  Laterality: N/A;   There are no problems to display for this patient.   PCP: [name], MD    REFERRING PROVIDER: [name], MD   REFERRING DIAG: Other abnormalities of gait and mobility   THERAPY DIAG:  Gait difficulty  Weakness of both lower extremities  Balance disorder  Rationale for Evaluation and Treatment: Rehabilitation  ONSET DATE: 06/2022  SUBJECTIVE:   SUBJECTIVE STATEMENT:  EVALUATION Pts. Wife reports pt. Is deaf in L ear and limited  hearing in R ear. Pt. Presents to PT with balance issues. PT. Has had balance issues about 6 months. Pt. Has issues maintaining balance especially when bending, steps, and maintaining standing position.   PERTINENT HISTORY: Primary hypertension  Type 2 diabetes mellitus with hyperglycemia, without long-term current use of insulin (CMS-HCC)  Bilateral hearing loss, unspecified hearing loss type  Low vitamin B12 level  Hx. of seizures(controlled)  See MD notes for more.  PAIN:  Are you having pain? No  PRECAUTIONS: Fall  WEIGHT BEARING RESTRICTIONS: No  FALLS:  Has patient fallen in last 6 months? Yes. Number of falls 10+, no major injuries.   LIVING ENVIRONMENT: Lives with: lives with their family and lives with their spouse Lives in: House/apartment Stairs: Yes: Internal: 12 steps; on right going up and External: 3 steps; on right going up Has following equipment at home: Single point cane  OCCUPATION: Retired   PLOF: Independent with basic ADLs  PATIENT GOALS: Prevent falls, increase balance    NEXT MD VISIT: TBD  OBJECTIVE:   PATIENT SURVEYS:  FOTO 57/67  COGNITION: Overall cognitive status: Within functional limits for tasks assessed     SENSATION: WFL  MUSCLE LENGTH: Hamstrings: TBD Thomas test: TBD  POSTURE: No Significant postural limitations  LOWER EXTREMITY ROM:  Active ROM Right eval Left eval  Hip flexion  WNL WNL  Hip extension    Hip abduction    Hip adduction WNL WNL  Hip internal rotation WNL WNL  Hip external rotation WNL WNL  Knee flexion WNL WNL  Knee extension WNL WNL  Ankle dorsiflexion WNL WNL  Ankle plantarflexion WNL WNL  Ankle inversion    Ankle eversion     (Blank rows = not tested)  LOWER EXTREMITY MMT:  MMT Right eval Left eval  Hip flexion 4-/5 4-/5  Hip extension    Hip abduction 4/5 4/5  Hip adduction    Hip internal rotation 5/5 5/5  Hip external rotation 5/5 5/5  Knee flexion 4/5 4/5  Knee extension 4/5  4/5  Ankle dorsiflexion 5/5 5/5  Ankle plantarflexion    Ankle inversion    Ankle eversion     (Blank rows = not tested)  FUNCTIONAL TESTS:  Berg Balance Scale: 47/56  GAIT: Distance walked: 60 ft. Assistive device utilized: None Level of assistance: Complete Independence Comments: Pt. Demonstrated decreased step cadence in order to maintain balance without an assistive device   TODAY'S TREATMENT:                                                                                                                              DATE: 12/21/2022   Subjective: Pt. Arrived to PT without use of SPC. Pt. Reports no falls since last PT visit.  Pt. Active with yardwork and using chainsaw yesterday.    There. Ex:  No charge - NuStep seat 7 with B UE/LE L5. 14 min. 0.6 miles.  Warm-up before balance tasks.   Neuro. Mm:  -BOSU step ups/ wt. Shifting in //-bars.  Light UE assist progressing to no UE assist.  SBA for safety.    -Rockerboard in //-bars: forward/backwards with light to no UE assist.  Pt. Challenged and improved at end of tx. Reassessment.   -Resisted gait: 2BTB 5x all 4-planes with no UE assist.  SBA for safety/ cuing.    -Alt. UE/LE touches in //-bars and clinic with extra time to understand task/ proprioception of crossing arms.  No LOB.    -Walking in clinic/ 6" step ups with minimal to no UE assist.  Cuing to maintain proper BOS.    - Walking in hallway and outside (hills/ grassy terrain) with use of gait belt.  PT cued pt. To increase dorsiflexion of B feet to avoid dragging of feet and increasing fall risk. Pt. Cued to increase arm swing to promote balanced gait. Walking on grassy terrain/ up and down curbs with no LOB.    PATIENT EDUCATION:  Education details: Pt. Educated on how weakness of LE muscles can effect balance and the importance of strengthening specific muscle groups. Pt. Educated on HEP and frequency of each exercise. Pt. Educated on length of recommended PT  treatment for maximum Pt. Benefit. Pt. Educated on the benefits of balance training and the importance of balance to remain independent and  prevent fall risk.  Person educated: Patient and Spouse Education method: Explanation, Demonstration, Verbal cues, and Handouts Education comprehension: verbalized understanding  HOME EXERCISE PROGRAM: Access Code: [email or web address] URL: [email or web address] Date: 11/18/2022 Prepared by: Dorcas Carrow Exercises - Standing with Head Rotation - 1 x daily - 7 x weekly - 3 sets - 10 reps - Standing March with Counter Support - 1 x daily - 7 x weekly - 3 sets - 10 reps - Standing Hip Abduction with Counter Support - 1 x daily - 7 x weekly - 2 sets - 10 reps   ASSESSMENT:  CLINICAL IMPRESSION: Patient arrived to PT today without use of SPC/assistive device. Pt. Has no Reports of pain. Pt. Is accompanied to PT with his wife due to having severe hearing loss. Pt. requires many tactile cues and demonstrations, esp. With alt. UE/LE touches. Pt. Has increased difficulty maintaining balance once LE muscles become fatigued. Pt. Is motivated to remain independent and prevent any further falls from occurring. Pt. Requires few rest breaks and shows good endurance. PT Used CGA/gait belt for safety throughout the duration of treatment. Pt. Demonstrates increased weakness on his R LE compared to his L.  Pt. Showed marked improvement during rockerboard and in his gait specifically by demonstrating increased dorsiflexion and heel strike to avoid dragging of his feet. PT cued pt. To increase arm swing to promote a balanced/more efficient gait. Pt. Is already an active individual and has good rehab potential with no serious co morbidities limiting his progression. Pt. Would benefit from skilled physical therapy to help him remain independent in his ADL's and prevent any future falls/avoid serious injury.  OBJECTIVE IMPAIRMENTS: Abnormal gait, decreased balance, decreased  coordination, decreased endurance, decreased knowledge of condition, difficulty walking, and decreased strength.   ACTIVITY LIMITATIONS: bending, standing, stairs, locomotion level, and maintaining balance.  PARTICIPATION LIMITATIONS: driving, community activity, and yard work  PERSONAL FACTORS: Age and Time since onset of injury/illness/exacerbation are also affecting patient's functional outcome.   REHAB POTENTIAL: Good  CLINICAL DECISION MAKING: Stable/uncomplicated  EVALUATION COMPLEXITY: Low   GOALS: Goals reviewed with patient? Yes  SHORT TERM GOALS: Target date: 12/09/2022 Pt. Independent with HEP to increase B hip strength 1/2 muscle grade to improve overall functional mobility. Baseline: 4/5 MMT (see above) Goal status: Partially met   LONG TERM GOALS: Target date: 12/30/2022  Pt. Will increase FOTO to 67 to improve functional mobility and decrease fall risk.  Baseline: 57.  3/11: 80 Goal status: Goal met  2.  Pt. Will increase BERG balance score to >51/56 to decrease fall risk and prevent major injury/hospital stays. Baseline: 47/56 Goal status: On-going  3.  Pt. Will ambulate with proper use of SPC with consistent 2-point gait pattern and no loss of balance on uneven/ grassy outdoor surfaces to improve independence in community.  Baseline: SPC Goal status: Partially met   PLAN:  PT FREQUENCY: 2x/week  PT DURATION: 6 weeks  PLANNED INTERVENTIONS: Therapeutic exercises, Therapeutic activity, Neuromuscular re-education, Balance training, Gait training, Patient/Family education, Self Care, Joint mobilization, Joint manipulation, Stair training, Cryotherapy, Moist heat, and Manual therapy  PLAN FOR NEXT SESSION: Continue neuromuscular balance training, progress LE strengthening.  Berg balance test  Pura Spice, PT, DPT # [id number] 12/21/2022, 12:45 PM

## 2022-12-23 ENCOUNTER — Ambulatory Visit: Payer: Medicare Other | Admitting: Physical Therapy

## 2022-12-23 ENCOUNTER — Ambulatory Visit: Payer: Medicare Other | Admitting: Dermatology

## 2022-12-23 VITALS — BP 122/62 | HR 72

## 2022-12-23 DIAGNOSIS — L578 Other skin changes due to chronic exposure to nonionizing radiation: Secondary | ICD-10-CM | POA: Diagnosis not present

## 2022-12-23 DIAGNOSIS — L570 Actinic keratosis: Secondary | ICD-10-CM

## 2022-12-23 DIAGNOSIS — D492 Neoplasm of unspecified behavior of bone, soft tissue, and skin: Secondary | ICD-10-CM

## 2022-12-23 DIAGNOSIS — C490 Malignant neoplasm of connective and soft tissue of head, face and neck: Secondary | ICD-10-CM

## 2022-12-23 DIAGNOSIS — D485 Neoplasm of uncertain behavior of skin: Secondary | ICD-10-CM

## 2022-12-23 DIAGNOSIS — L821 Other seborrheic keratosis: Secondary | ICD-10-CM

## 2022-12-23 DIAGNOSIS — C4490 Unspecified malignant neoplasm of skin, unspecified: Secondary | ICD-10-CM

## 2022-12-23 HISTORY — DX: Unspecified malignant neoplasm of skin, unspecified: C44.90

## 2022-12-23 NOTE — Patient Instructions (Addendum)
Wound Care Instructions  Cleanse wound gently with soap and water once a day then pat dry with clean gauze. Apply a thin coat of Petrolatum (petroleum jelly, "Vaseline") over the wound (unless you have an allergy to this). We recommend that you use a new, sterile tube of Vaseline. Do not pick or remove scabs. Do not remove the yellow or white "healing tissue" from the base of the wound.  Cover the wound with fresh, clean, nonstick gauze and secure with paper tape. You may use Band-Aids in place of gauze and tape if the wound is small enough, but would recommend trimming much of the tape off as there is often too much. Sometimes Band-Aids can irritate the skin.  You should call the office for your biopsy report after 1 week if you have not already been contacted.  If you experience any problems, such as abnormal amounts of bleeding, swelling, significant bruising, significant pain, or evidence of infection, please call the office immediately.  FOR ADULT SURGERY PATIENTS: If you need something for pain relief you may take 1 extra strength Tylenol (acetaminophen) AND 2 Ibuprofen (200mg each) together every 4 hours as needed for pain. (do not take these if you are allergic to them or if you have a reason you should not take them.) Typically, you may only need pain medication for 1 to 3 days.      Cryotherapy Aftercare  Wash gently with soap and water everyday.   Apply Vaseline and Band-Aid daily until healed.      Due to recent changes in healthcare laws, you may see results of your pathology and/or laboratory studies on MyChart before the doctors have had a chance to review them. We understand that in some cases there may be results that are confusing or concerning to you. Please understand that not all results are received at the same time and often the doctors may need to interpret multiple results in order to provide you with the best plan of care or course of treatment. Therefore, we ask that  you please give us 2 business days to thoroughly review all your results before contacting the office for clarification. Should we see a critical lab result, you will be contacted sooner.   If You Need Anything After Your Visit  If you have any questions or concerns for your doctor, please call our main line at [phone] and press option 4 to reach your doctor's medical assistant. If no one answers, please leave a voicemail as directed and we will return your call as soon as possible. Messages left after 4 pm will be answered the following business day.   You may also send us a message via MyChart. We typically respond to MyChart messages within 1-2 business days.  For prescription refills, please ask your pharmacy to contact our office. Our fax number is [phone].  If you have an urgent issue when the clinic is closed that cannot wait until the next business day, you can page your doctor at the number below.    Please note that while we do our best to be available for urgent issues outside of office hours, we are not available 24/7.   If you have an urgent issue and are unable to reach us, you may choose to seek medical care at your doctor's office, retail clinic, urgent care center, or emergency room.  If you have a medical emergency, please immediately call 911 or go to the emergency department.  Pager Numbers  - Dr.   Kowalski: [phone]  - Dr. Moye: [phone]  - Dr. Stewart: [phone]  In the event of inclement weather, please call our main line at [phone] for an update on the status of any delays or closures.  Dermatology Medication Tips: Please keep the boxes that topical medications come in in order to help keep track of the instructions about where and how to use these. Pharmacies typically print the medication instructions only on the boxes and not directly on the medication tubes.   If your medication is too expensive, please contact our office at  [phone] option 4 or send us a message through MyChart.   We are unable to tell what your co-pay for medications will be in advance as this is different depending on your insurance coverage. However, we may be able to find a substitute medication at lower cost or fill out paperwork to get insurance to cover a needed medication.   If a prior authorization is required to get your medication covered by your insurance company, please allow us 1-2 business days to complete this process.  Drug prices often vary depending on where the prescription is filled and some pharmacies may offer cheaper prices.  The website www.goodrx.com contains coupons for medications through different pharmacies. The prices here do not account for what the cost may be with help from insurance (it may be cheaper with your insurance), but the website can give you the price if you did not use any insurance.  - You can print the associated coupon and take it with your prescription to the pharmacy.  - You may also stop by our office during regular business hours and pick up a GoodRx coupon card.  - If you need your prescription sent electronically to a different pharmacy, notify our office through [facility] MyChart or by phone at [phone] option 4.     Si Usted Necesita Algo Despus de Su Visita  Tambin puede enviarnos un mensaje a travs de MyChart. Por lo general respondemos a los mensajes de MyChart en el transcurso de 1 a 2 das hbiles.  Para renovar recetas, por favor pida a su farmacia que se ponga en contacto con nuestra oficina. Nuestro nmero de fax es el [phone].  Si tiene un asunto urgente cuando la clnica est cerrada y que no puede esperar hasta el siguiente da hbil, puede llamar/localizar a su doctor(a) al nmero que aparece a continuacin.   Por favor, tenga en cuenta que aunque hacemos todo lo posible para estar disponibles para asuntos urgentes fuera del horario de oficina, no estamos  disponibles las 24 horas del da, los 7 das de la semana.   Si tiene un problema urgente y no puede comunicarse con nosotros, puede optar por buscar atencin mdica  en el consultorio de su doctor(a), en una clnica privada, en un centro de atencin urgente o en una sala de emergencias.  Si tiene una emergencia mdica, por favor llame inmediatamente al 911 o vaya a la sala de emergencias.  Nmeros de bper  - Dr. Kowalski: [phone]  - Dra. Moye: [phone]  - Dra. Stewart: [phone]  En caso de inclemencias del tiempo, por favor llame a nuestra lnea principal al [phone] para una actualizacin sobre el estado de cualquier retraso o cierre.  Consejos para la medicacin en dermatologa: Por favor, guarde las cajas en las que vienen los medicamentos de uso tpico para ayudarle a seguir las instrucciones sobre dnde y cmo usarlos. Las farmacias generalmente imprimen las instrucciones del medicamento slo   en las cajas y no directamente en los tubos del medicamento.   Si su medicamento es muy caro, por favor, pngase en contacto con nuestra oficina llamando al [phone] y presione la opcin 4 o envenos un mensaje a travs de MyChart.   No podemos decirle cul ser su copago por los medicamentos por adelantado ya que esto es diferente dependiendo de la cobertura de su seguro. Sin embargo, es posible que podamos encontrar un medicamento sustituto a menor costo o llenar un formulario para que el seguro cubra el medicamento que se considera necesario.   Si se requiere una autorizacin previa para que su compaa de seguros cubra su medicamento, por favor permtanos de 1 a 2 das hbiles para completar este proceso.  Los precios de los medicamentos varan con frecuencia dependiendo del lugar de dnde se surte la receta y alguna farmacias pueden ofrecer precios ms baratos.  El sitio web www.goodrx.com tiene cupones para medicamentos de diferentes farmacias. Los precios aqu no  tienen en cuenta lo que podra costar con la ayuda del seguro (puede ser ms barato con su seguro), pero el sitio web puede darle el precio si no utiliz ningn seguro.  - Puede imprimir el cupn correspondiente y llevarlo con su receta a la farmacia.  - Tambin puede pasar por nuestra oficina durante el horario de atencin regular y recoger una tarjeta de cupones de GoodRx.  - Si necesita que su receta se enve electrnicamente a una farmacia diferente, informe a nuestra oficina a travs de MyChart de [facility] o por telfono llamando al [phone] y presione la opcin 4.  

## 2022-12-23 NOTE — Progress Notes (Signed)
Follow-Up Visit   Subjective  Brandon Barrett is a 87 y.o. male who presents for the following: Skin Problem. The patient has spots on his scalp to be evaluated, hx of bleeding.  The patient has spots, moles and lesions to be evaluated, some may be new or changing and the patient has concerns that these could be cancer.  Wife with patient   The following portions of the chart were reviewed this encounter and updated as appropriate:   Tobacco  Allergies  Meds  Problems  Med Hx  Surg Hx  Fam Hx     Review of Systems:  No other skin or systemic complaints except as noted in HPI or Assessment and Plan.  Objective  Well appearing patient in no apparent distress; mood and affect are within normal limits.  A focused examination was performed including scalp. Relevant physical exam findings are noted in the Assessment and Plan.  right scalp 1.1 cm erythematous papule        left posterior scalp x 2 (2) Erythematous thin papules/macules with gritty scale.    Assessment & Plan  Neoplasm of skin right scalp  Epidermal / dermal shaving  Lesion diameter (cm):  1.1 Informed consent: discussed and consent obtained   Timeout: patient name, date of birth, surgical site, and procedure verified   Procedure prep:  Patient was prepped and draped in usual sterile fashion Prep type:  Isopropyl alcohol Anesthesia: the lesion was anesthetized in a standard fashion   Anesthetic:  1% lidocaine w/ epinephrine 1-100,000 buffered w/ 8.4% NaHCO3 Hemostasis achieved with: pressure, aluminum chloride and electrodesiccation   Outcome: patient tolerated procedure well   Post-procedure details: sterile dressing applied and wound care instructions given   Dressing type: bandage and petrolatum    Destruction of lesion  Destruction method: electrodesiccation and curettage   Informed consent: discussed and consent obtained   Timeout:  patient name, date of birth, surgical site, and  procedure verified Curettage performed in three different directions: Yes   Electrodesiccation performed over the curetted area: Yes   Lesion length (cm):  1.1 Lesion width (cm):  1.1 Margin per side (cm):  0.2 Final wound size (cm):  1.5 Hemostasis achieved with:  pressure, aluminum chloride and electrodesiccation Outcome: patient tolerated procedure well with no complications   Post-procedure details: wound care instructions given    Specimen 1 - Surgical pathology Differential Diagnosis: R/O Skin cancer   Check Margins: No  AK (actinic keratosis) (2) left posterior scalp x 2  Actinic keratoses are precancerous spots that appear secondary to cumulative UV radiation exposure/sun exposure over time. They are chronic with expected duration over 1 year. A portion of actinic keratoses will progress to squamous cell carcinoma of the skin. It is not possible to reliably predict which spots will progress to skin cancer and so treatment is recommended to prevent development of skin cancer.  Recommend daily broad spectrum sunscreen SPF 30+ to sun-exposed areas, reapply every 2 hours as needed.  Recommend staying in the shade or wearing long sleeves, sun glasses (UVA+UVB protection) and wide brim hats (4-inch brim around the entire circumference of the hat). Call for new or changing lesions.   Destruction of lesion - left posterior scalp x 2 Complexity: simple   Destruction method: cryotherapy   Informed consent: discussed and consent obtained   Timeout:  patient name, date of birth, surgical site, and procedure verified Lesion destroyed using liquid nitrogen: Yes   Region frozen until ice ball extended beyond  lesion: Yes   Outcome: patient tolerated procedure well with no complications   Post-procedure details: wound care instructions given    Actinic Damage - chronic, secondary to cumulative UV radiation exposure/sun exposure over time - diffuse scaly erythematous macules with  underlying dyspigmentation - Recommend daily broad spectrum sunscreen SPF 30+ to sun-exposed areas, reapply every 2 hours as needed.  - Recommend staying in the shade or wearing long sleeves, sun glasses (UVA+UVB protection) and wide brim hats (4-inch brim around the entire circumference of the hat). - Call for new or changing lesions.  Seborrheic Keratoses - Stuck-on, waxy, tan-brown papules and/or plaques  - Benign-appearing - Discussed benign etiology and prognosis. - Observe - Call for any changes  Return in about 6 months (around 06/25/2023) for AKs .  IMarye Round, CMA, am acting as scribe for Sarina Ser, MD .  Documentation: I have reviewed the above documentation for accuracy and completeness, and I agree with the above.  Sarina Ser, MD

## 2022-12-23 NOTE — Progress Notes (Deleted)
   Follow-Up Visit   Subjective  Brandon Barrett is a 87 y.o. male who presents for the following: Skin Problem.    The following portions of the chart were reviewed this encounter and updated as appropriate:       Review of Systems:  No other skin or systemic complaints except as noted in HPI or Assessment and Plan.  Objective  Well appearing patient in no apparent distress; mood and affect are within normal limits.  A focused examination was performed including scalp. Relevant physical exam findings are noted in the Assessment and Plan.  right scalp 1.1 cm erythematous papule    I, Marye Round, CMA, am acting as scribe for Sarina Ser, MD .  Assessment & Plan  Neoplasm of skin right scalp   No follow-ups on file.

## 2022-12-25 ENCOUNTER — Encounter: Payer: Self-pay | Admitting: Dermatology

## 2022-12-28 ENCOUNTER — Encounter: Payer: Self-pay | Admitting: Physical Therapy

## 2022-12-28 ENCOUNTER — Ambulatory Visit: Payer: Medicare Other | Admitting: Physical Therapy

## 2022-12-28 DIAGNOSIS — R2689 Other abnormalities of gait and mobility: Secondary | ICD-10-CM

## 2022-12-28 DIAGNOSIS — R269 Unspecified abnormalities of gait and mobility: Secondary | ICD-10-CM | POA: Diagnosis not present

## 2022-12-28 DIAGNOSIS — R29898 Other symptoms and signs involving the musculoskeletal system: Secondary | ICD-10-CM

## 2022-12-28 NOTE — Therapy (Signed)
OUTPATIENT PHYSICAL THERAPY LOWER EXTREMITY TREATMENT  Patient Name: Brandon Barrett [facility] "[name]" MRN: [id number] DOB:[birth year], 87 y.o., male Today's Date: 12/28/2022  END OF SESSION:  PT End of Session - 12/28/22 0929     Visit Number 11    Number of Visits 13    Date for PT Re-Evaluation 12/30/22    PT Start Time 0929    PT Stop Time 1026    PT Time Calculation (min) 57 min    Equipment Utilized During Treatment Gait belt    Activity Tolerance Patient tolerated treatment well    Behavior During Therapy [facility] for tasks assessed/performed             Past Medical History:  Diagnosis Date   Basal cell carcinoma 12/13/2013   left inferior forehead    Basal cell carcinoma 07/03/2014   left temple zygoma    Basal cell carcinoma 12/13/2013   left forehead    Basal cell carcinoma 01/21/2015   R mid back paraspinal    BCC (basal cell carcinoma) 09/28/2022   left forehead 2 cm above brow, tx'd with EDC   Squamous cell carcinoma of skin 11/24/2013   right cheek    Squamous cell carcinoma of skin 09/22/2007   L parietal scalp    Squamous cell carcinoma of skin 09/22/2021   L ear - ED&C   Past Surgical History:  Procedure Laterality Date   TEE WITHOUT CARDIOVERSION N/A 09/17/2022   Procedure: TRANSESOPHAGEAL ECHOCARDIOGRAM (TEE);  Surgeon: [name], MD;  Location: [facility] ORS;  Service: Cardiovascular;  Laterality: N/A;   There are no problems to display for this patient.   PCP: [name], MD    REFERRING PROVIDER: [name], MD   REFERRING DIAG: Other abnormalities of gait and mobility   THERAPY DIAG:  Gait difficulty  Weakness of both lower extremities  Balance disorder  Rationale for Evaluation and Treatment: Rehabilitation  ONSET DATE: 06/2022  SUBJECTIVE:   SUBJECTIVE STATEMENT:  EVALUATION Pts. Wife reports pt. Is deaf in L ear and limited hearing in R ear. Pt. Presents to PT with balance issues. PT. Has had balance issues  about 6 months. Pt. Has issues maintaining balance especially when bending, steps, and maintaining standing position.   PERTINENT HISTORY: Primary hypertension  Type 2 diabetes mellitus with hyperglycemia, without long-term current use of insulin (CMS-HCC)  Bilateral hearing loss, unspecified hearing loss type  Low vitamin B12 level  Hx. of seizures(controlled)  See MD notes for more.  PAIN:  Are you having pain? No  PRECAUTIONS: Fall  WEIGHT BEARING RESTRICTIONS: No  FALLS:  Has patient fallen in last 6 months? Yes. Number of falls 10+, no major injuries.   LIVING ENVIRONMENT: Lives with: lives with their family and lives with their spouse Lives in: House/apartment Stairs: Yes: Internal: 12 steps; on right going up and External: 3 steps; on right going up Has following equipment at home: Single point cane  OCCUPATION: Retired   PLOF: Independent with basic ADLs  PATIENT GOALS: Prevent falls, increase balance    NEXT MD VISIT: TBD  OBJECTIVE:   PATIENT SURVEYS:  FOTO 57/67  COGNITION: Overall cognitive status: Within functional limits for tasks assessed     SENSATION: WFL  MUSCLE LENGTH: Hamstrings: TBD Thomas test: TBD  POSTURE: No Significant postural limitations  LOWER EXTREMITY ROM:  Active ROM Right eval Left eval  Hip flexion WNL WNL  Hip extension    Hip abduction    Hip adduction WNL  WNL  Hip internal rotation WNL WNL  Hip external rotation WNL WNL  Knee flexion WNL WNL  Knee extension WNL WNL  Ankle dorsiflexion WNL WNL  Ankle plantarflexion WNL WNL  Ankle inversion    Ankle eversion     (Blank rows = not tested)  LOWER EXTREMITY MMT:  MMT Right eval Left eval  Hip flexion 4-/5 4-/5  Hip extension    Hip abduction 4/5 4/5  Hip adduction    Hip internal rotation 5/5 5/5  Hip external rotation 5/5 5/5  Knee flexion 4/5 4/5  Knee extension 4/5 4/5  Ankle dorsiflexion 5/5 5/5  Ankle plantarflexion    Ankle inversion    Ankle  eversion     (Blank rows = not tested)  FUNCTIONAL TESTS:  Berg Balance Scale: 47/56  GAIT: Distance walked: 60 ft. Assistive device utilized: None Level of assistance: Complete Independence Comments: Pt. Demonstrated decreased step cadence in order to maintain balance without an assistive device   TODAY'S TREATMENT:                                                                                                                              DATE: 12/28/2022   Subjective: Pt. Planted 9 iris plants last week while kneeling down and had difficulty returning to standing without assist of UE on Gator and yard tool to pull up.  Pt. C/o R knee discomfort but states pain is better today than over weekend.  No LOB or falls reported.    There. Ex:  No charge - NuStep seat 7 with B UE/LE L5. 15 min. 0.72 miles.  Warm-up before balance tasks.   Neuro. Mm:  -tall kneeling on blue mat/ Airex pad a //-bars with focus on return to standing.  Pt. Challenged with returning to stand and benefits from light UE assist/ SBA for safety.     Merrilee Jansky balance test: 51/56 (improvement)  -BOSU step ups/ Reverse BOSU wt. Shifting in //-bars.  Light UE assist progressing to no UE assist.  SBA for safety.    -Walking in hallway (added ball toss)- 2 laps.  Tandem stance/ SLS/ EC/ functional reaching.    -5# ankle wts.: marching/ lateral walking/ knee flexion with added walking.  3 laps in //-bars with mirror feedback.    -Alt. UE/LE touches in //-bars and clinic with extra time to understand task/ proprioception of crossing arms.  No LOB.     - Walking outside (hills/ grassy terrain) with no gait belt.  PT cued pt. To increase dorsiflexion of B feet to avoid dragging of feet and increasing fall risk. Pt. Cued to increase arm swing to promote balanced gait. Walking on grassy terrain/ up and down curbs with no LOB.    PATIENT EDUCATION:  Education details: Pt. Educated on how weakness of LE muscles can effect  balance and the importance of strengthening specific muscle groups. Pt. Educated on HEP and frequency of each exercise. Pt. Educated  on length of recommended PT treatment for maximum Pt. Benefit. Pt. Educated on the benefits of balance training and the importance of balance to remain independent and prevent fall risk.  Person educated: Patient and Spouse Education method: Explanation, Demonstration, Verbal cues, and Handouts Education comprehension: verbalized understanding  HOME EXERCISE PROGRAM: Access Code: [name] URL: [email or web address] Date: 11/18/2022 Prepared by: Dorcas Carrow Exercises - Standing with Head Rotation - 1 x daily - 7 x weekly - 3 sets - 10 reps - Standing March with Counter Support - 1 x daily - 7 x weekly - 3 sets - 10 reps - Standing Hip Abduction with Counter Support - 1 x daily - 7 x weekly - 2 sets - 10 reps   ASSESSMENT:  CLINICAL IMPRESSION: Pt. reports slight increase in R knee pain after doing yard work last week.  R knee pain did not limit treatment/ balance tasks today.  Pt. Is accompanied to PT with his wife due to having severe hearing loss. Pt. requires many tactile cues and demonstrations, esp. With alt. UE/LE touches. Pt. Has increased difficulty maintaining balance once LE muscles become fatigued. Pt. Is motivated to remain independent and prevent any further falls from occurring. Pt. Showed marked improvement with Berg balance test.  PT cued pt. To increase arm swing to promote a balanced/more efficient gait. Pt. Is already an active individual and has good rehab potential with no serious co morbidities limiting his progression. Pt. Would benefit from skilled physical therapy to help him remain independent in his ADL's and prevent any future falls/avoid serious injury.  OBJECTIVE IMPAIRMENTS: Abnormal gait, decreased balance, decreased coordination, decreased endurance, decreased knowledge of condition, difficulty walking, and decreased  strength.   ACTIVITY LIMITATIONS: bending, standing, stairs, locomotion level, and maintaining balance.  PARTICIPATION LIMITATIONS: driving, community activity, and yard work  PERSONAL FACTORS: Age and Time since onset of injury/illness/exacerbation are also affecting patient's functional outcome.   REHAB POTENTIAL: Good  CLINICAL DECISION MAKING: Stable/uncomplicated  EVALUATION COMPLEXITY: Low   GOALS: Goals reviewed with patient? Yes  SHORT TERM GOALS: Target date: 12/09/2022 Pt. Independent with HEP to increase B hip strength 1/2 muscle grade to improve overall functional mobility. Baseline: 4/5 MMT (see above) Goal status: Partially met   LONG TERM GOALS: Target date: 12/30/2022  Pt. Will increase FOTO to 67 to improve functional mobility and decrease fall risk.  Baseline: 57.  3/11: 80 Goal status: Goal met  2.  Pt. Will increase BERG balance score to >51/56 to decrease fall risk and prevent major injury/hospital stays. Baseline: 47/56 Goal status: On-going  3.  Pt. Will ambulate with proper use of SPC with consistent 2-point gait pattern and no loss of balance on uneven/ grassy outdoor surfaces to improve independence in community.  Baseline: SPC Goal status: Partially met   PLAN:  PT FREQUENCY: 2x/week  PT DURATION: 6 weeks  PLANNED INTERVENTIONS: Therapeutic exercises, Therapeutic activity, Neuromuscular re-education, Balance training, Gait training, Patient/Family education, Self Care, Joint mobilization, Joint manipulation, Stair training, Cryotherapy, Moist heat, and Manual therapy  PLAN FOR NEXT SESSION: Continue neuromuscular balance training, progress LE strengthening.  CHECK GOALS/ END OF CERT  Pura Spice, PT, DPT # [phone] 12/28/2022, 7:24 PM

## 2022-12-30 ENCOUNTER — Encounter: Payer: Self-pay | Admitting: Physical Therapy

## 2022-12-30 ENCOUNTER — Ambulatory Visit: Payer: Medicare Other | Admitting: Physical Therapy

## 2022-12-30 DIAGNOSIS — R29898 Other symptoms and signs involving the musculoskeletal system: Secondary | ICD-10-CM

## 2022-12-30 DIAGNOSIS — R269 Unspecified abnormalities of gait and mobility: Secondary | ICD-10-CM

## 2022-12-30 DIAGNOSIS — R2689 Other abnormalities of gait and mobility: Secondary | ICD-10-CM

## 2022-12-30 NOTE — Therapy (Addendum)
OUTPATIENT PHYSICAL THERAPY LOWER EXTREMITY TREATMENT  Patient Name: Brandon Barrett [facility] "Brandon Barrett" MRN: [id number] DOB:02-15-1936, 87 y.o., male Today's Date: 12/30/2022  END OF SESSION:  PT End of Session - 12/30/22 0950     Visit Number 12    Number of Visits 13    Date for PT Re-Evaluation 12/30/22    PT Start Time 0939    PT Stop Time 1040    PT Time Calculation (min) 61 min    Equipment Utilized During Treatment Gait belt    Activity Tolerance Patient tolerated treatment well    Behavior During Therapy [facility] for tasks assessed/performed             Past Medical History:  Diagnosis Date   Basal cell carcinoma 12/13/2013   left inferior forehead    Basal cell carcinoma 07/03/2014   left temple zygoma    Basal cell carcinoma 12/13/2013   left forehead    Basal cell carcinoma 01/21/2015   R mid back paraspinal    BCC (basal cell carcinoma) 09/28/2022   left forehead 2 cm above brow, tx'd with EDC   Squamous cell carcinoma of skin 11/24/2013   right cheek    Squamous cell carcinoma of skin 09/22/2007   L parietal scalp    Squamous cell carcinoma of skin 09/22/2021   L ear - ED&C   Past Surgical History:  Procedure Laterality Date   TEE WITHOUT CARDIOVERSION N/A 09/17/2022   Procedure: TRANSESOPHAGEAL ECHOCARDIOGRAM (TEE);  Surgeon: [name], MD;  Location: [facility] ORS;  Service: Cardiovascular;  Laterality: N/A;   There are no problems to display for this patient.   PCP: [name], MD    REFERRING PROVIDER: [name], MD   REFERRING DIAG: Other abnormalities of gait and mobility   THERAPY DIAG:  Gait difficulty  Weakness of both lower extremities  Balance disorder  Rationale for Evaluation and Treatment: Rehabilitation  ONSET DATE: 06/2022  SUBJECTIVE:   SUBJECTIVE STATEMENT:  EVALUATION Pts. Wife reports pt. Is deaf in L ear and limited hearing in R ear. Pt. Presents to PT with balance issues. PT. Has had balance issues  about 6 months. Pt. Has issues maintaining balance especially when bending, steps, and maintaining standing position.   PERTINENT HISTORY: Primary hypertension  Type 2 diabetes mellitus with hyperglycemia, without long-term current use of insulin (CMS-HCC)  Bilateral hearing loss, unspecified hearing loss type  Low vitamin B12 level  Hx. of seizures(controlled)  See MD notes for more.  PAIN:  Are you having pain? No  PRECAUTIONS: Fall  WEIGHT BEARING RESTRICTIONS: No  FALLS:  Has patient fallen in last 6 months? Yes. Number of falls 10+, no major injuries.   LIVING ENVIRONMENT: Lives with: lives with their family and lives with their spouse Lives in: House/apartment Stairs: Yes: Internal: 12 steps; on right going up and External: 3 steps; on right going up Has following equipment at home: Single point cane  OCCUPATION: Retired   PLOF: Independent with basic ADLs  PATIENT GOALS: Prevent falls, increase balance    NEXT MD VISIT: TBD  OBJECTIVE:   PATIENT SURVEYS:  FOTO 57/67  COGNITION: Overall cognitive status: Within functional limits for tasks assessed     SENSATION: WFL  MUSCLE LENGTH: Hamstrings: TBD Thomas test: TBD  POSTURE: No Significant postural limitations  LOWER EXTREMITY ROM:  Active ROM Right eval Left eval  Hip flexion WNL WNL  Hip extension    Hip abduction    Hip adduction WNL  WNL  Hip internal rotation WNL WNL  Hip external rotation WNL WNL  Knee flexion WNL WNL  Knee extension WNL WNL  Ankle dorsiflexion WNL WNL  Ankle plantarflexion WNL WNL  Ankle inversion    Ankle eversion     (Blank rows = not tested)  LOWER EXTREMITY MMT:  MMT Right eval Left eval  Hip flexion 4-/5 4-/5  Hip extension    Hip abduction 4/5 4/5  Hip adduction    Hip internal rotation 5/5 5/5  Hip external rotation 5/5 5/5  Knee flexion 4/5 4/5  Knee extension 4/5 4/5  Ankle dorsiflexion 5/5 5/5  Ankle plantarflexion    Ankle inversion    Ankle  eversion     (Blank rows = not tested)  FUNCTIONAL TESTS:  Berg Balance Scale: 47/56  GAIT: Distance walked: 60 ft. Assistive device utilized: None Level of assistance: Complete Independence Comments: Pt. Demonstrated decreased step cadence in order to maintain balance without an assistive device  -Berg balance test: 51/56 (improvement)  12/28/22.    TODAY'S TREATMENT:                                                                                                                              DATE: 12/30/2022   Subjective: Pt. Reports no new issues.  Pt. Using ankle wts. With HEP/ walking.  Pt. Is leaving for [place] on 4/1 for 2 weeks.  Pt. Reports minimal medial knee discomfort.  PT examined knee and pt. Has light bruise noted.     There. Ex:    -NuStep seat 7 with B UE/LE L5. 12 min. 0.68 miles.  Warm-up before balance tasks.   -5# ankle wts.: marching/ lateral walking/ knee flexion with added walking.  3 laps in //-bars with mirror feedback.  Extra time with lateral walking for control to prevent scissoring.  -STS 10x from gray chair with no UE assist.  Good eccentric quad control.    -step ups/downs with no UE assist and recip. Pattern on 4 stairs with focus on descending/ eccentric quad control.    -17# box floor to waist lifting with proper technique/ B carry around PT gym.  -reassessment of goal/ MMT.    Neuro. Mm:   -Airex step ups/ overs and added a 12" hurdle with consistent step pattern.    -tall kneeling on blue mat/ Airex pad a //-bars with focus on return to standing.  Pt. Challenged with returning to stand and benefits from light UE assist/ SBA for safety.     -walking in clinic/ outside (hills/ grassy terrain) with no gait belt.  PT cued pt. To increase dorsiflexion of B feet to avoid dragging of feet and increasing fall risk. Pt. Cued to increase arm swing to promote balanced gait. Walking on grassy terrain/ up and down curbs with no LOB.    PATIENT  EDUCATION:  Education details: Pt. Educated on how weakness of LE muscles can effect balance and the importance  of strengthening specific muscle groups. Pt. Educated on HEP and frequency of each exercise. Pt. Educated on length of recommended PT treatment for maximum Pt. Benefit. Pt. Educated on the benefits of balance training and the importance of balance to remain independent and prevent fall risk.  Person educated: Patient and Spouse Education method: Explanation, Demonstration, Verbal cues, and Handouts Education comprehension: verbalized understanding  HOME EXERCISE PROGRAM: Access Code: [name] URL: [email or web address] Date: 11/18/2022 Prepared by: Dorcas Carrow Exercises - Standing with Head Rotation - 1 x daily - 7 x weekly - 3 sets - 10 reps - Standing March with Counter Support - 1 x daily - 7 x weekly - 3 sets - 10 reps - Standing Hip Abduction with Counter Support - 1 x daily - 7 x weekly - 2 sets - 10 reps   ASSESSMENT:  CLINICAL IMPRESSION: Pt. Is accompanied to PT with his wife due to having severe hearing loss. Pt. requires many tactile cues and demonstrations, esp. With balance tasks.  Pt. Has increased difficulty maintaining balance once LE muscles become fatigued. Marked improvement in B quad/hamstring strength noted since initial evaluation.  Pt. Is motivated to remain independent and prevent any further falls from occurring. PT cued pt. To increase arm swing to promote a balanced/more efficient gait. Pt. Is already an active individual and has good rehab potential with no serious co morbidities limiting his progression. Pt. Would benefit from skilled physical therapy to help him remain independent in his ADL's and prevent any future falls/avoid serious injury.  OBJECTIVE IMPAIRMENTS: Abnormal gait, decreased balance, decreased coordination, decreased endurance, decreased knowledge of condition, difficulty walking, and decreased strength.   ACTIVITY LIMITATIONS:  bending, standing, stairs, locomotion level, and maintaining balance.  PARTICIPATION LIMITATIONS: driving, community activity, and yard work  PERSONAL FACTORS: Age and Time since onset of injury/illness/exacerbation are also affecting patient's functional outcome.   REHAB POTENTIAL: Good  CLINICAL DECISION MAKING: Stable/uncomplicated  EVALUATION COMPLEXITY: Low   GOALS: Goals reviewed with patient? Yes  SHORT TERM GOALS: Target date: 12/09/2022 Pt. Independent with HEP to increase B hip strength 1/2 muscle grade to improve overall functional mobility. Baseline: 4/5 MMT (see above).  3/20: B knee extension/ flexion 5/5 MMT, hip flexion/abduction 4+/5 MMT.  (Marked improvement).   Goal status: Goal met   LONG TERM GOALS: Target date: 12/30/2022  Pt. Will increase FOTO to 67 to improve functional mobility and decrease fall risk.  Baseline: 57.  3/11: 80 Goal status: Goal met  2.  Pt. Will increase BERG balance score to >51/56 to decrease fall risk and prevent major injury/hospital stays. Baseline: 47/56.  3/18: 51/56 (marked improvement).   Goal status: Partially met  3.  Pt. Will ambulate with proper use of SPC with consistent 2-point gait pattern and no loss of balance on uneven/ grassy outdoor surfaces to improve independence in community.  Baseline: SPC Goal status: Partially met   PLAN:  PT FREQUENCY: 2x/week  PT DURATION: 6 weeks  PLANNED INTERVENTIONS: Therapeutic exercises, Therapeutic activity, Neuromuscular re-education, Balance training, Gait training, Patient/Family education, Self Care, Joint mobilization, Joint manipulation, Stair training, Cryotherapy, Moist heat, and Manual therapy  PLAN FOR NEXT SESSION: RECERT for 1 week until pt. Returns to [place].    Pura Spice, PT, DPT # [phone] 12/30/2022, 10:41 AM

## 2023-01-04 ENCOUNTER — Encounter: Payer: Self-pay | Admitting: Physical Therapy

## 2023-01-04 ENCOUNTER — Telehealth: Payer: Self-pay

## 2023-01-04 ENCOUNTER — Ambulatory Visit: Payer: Medicare Other | Admitting: Physical Therapy

## 2023-01-04 DIAGNOSIS — R269 Unspecified abnormalities of gait and mobility: Secondary | ICD-10-CM

## 2023-01-04 DIAGNOSIS — R29898 Other symptoms and signs involving the musculoskeletal system: Secondary | ICD-10-CM

## 2023-01-04 DIAGNOSIS — R2689 Other abnormalities of gait and mobility: Secondary | ICD-10-CM

## 2023-01-04 NOTE — Telephone Encounter (Signed)
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

## 2023-01-04 NOTE — Therapy (Signed)
OUTPATIENT PHYSICAL THERAPY LOWER EXTREMITY TREATMENT/ RECERTIFICATION  Patient Name: Brandon Barrett "Brandon Barrett" MRN: [id number] DOB:14-Jul-1936, 87 y.o., male Today's Date: 01/04/2023  END OF SESSION:  PT End of Session - 01/04/23 0948     Visit Number 13    Number of Visits 14    Date for PT Re-Evaluation 01/11/23    PT Start Time 0942    PT Stop Time 1032    PT Time Calculation (min) 50 min    Equipment Utilized During Treatment Gait belt    Activity Tolerance Patient tolerated treatment well    Behavior During Therapy [facility] for tasks assessed/performed             Past Medical History:  Diagnosis Date   Basal cell carcinoma 12/13/2013   left inferior forehead    Basal cell carcinoma 07/03/2014   left temple zygoma    Basal cell carcinoma 12/13/2013   left forehead    Basal cell carcinoma 01/21/2015   R mid back paraspinal    BCC (basal cell carcinoma) 09/28/2022   left forehead 2 cm above brow, tx'd with EDC   Skin cancer 12/23/2022   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   PCP: [name], MD    REFERRING PROVIDER: [name], MD   REFERRING DIAG: Other abnormalities of gait and mobility   THERAPY DIAG:  Gait difficulty  Weakness of both lower extremities  Balance disorder  Rationale for Evaluation and Treatment: Rehabilitation  ONSET DATE: 06/2022  SUBJECTIVE:   SUBJECTIVE STATEMENT:  EVALUATION Pts. Wife reports pt. Is deaf in L ear  and limited hearing in R ear. Pt. Presents to PT with balance issues. PT. Has had balance issues about 6 months. Pt. Has issues maintaining balance especially when bending, steps, and maintaining standing position.   PERTINENT HISTORY: Primary hypertension  Type 2 diabetes mellitus with hyperglycemia, without long-term current use of insulin (CMS-HCC)  Bilateral hearing loss, unspecified hearing loss type  Low vitamin B12 level  Hx. of seizures(controlled)  See MD notes for more.  PAIN:  Are you having pain? No  PRECAUTIONS: Fall  WEIGHT BEARING RESTRICTIONS: No  FALLS:  Has patient fallen in last 6 months? Yes. Number of falls 10+, no major injuries.   LIVING ENVIRONMENT: Lives with: lives with their family and lives with their spouse Lives in: House/apartment Stairs: Yes: Internal: 12 steps; on right going up and External: 3 steps; on right going up Has following equipment at home: Single point cane  OCCUPATION: Retired   PLOF: Independent with basic ADLs  PATIENT GOALS: Prevent falls, increase balance    NEXT MD VISIT: TBD  OBJECTIVE:   PATIENT SURVEYS:  FOTO 57/67  COGNITION: Overall cognitive status: Within functional limits for tasks assessed     SENSATION: WFL  MUSCLE LENGTH: Hamstrings: TBD Thomas test: TBD  POSTURE: No Significant postural limitations  LOWER EXTREMITY ROM:  Active ROM Right eval Left eval  Hip flexion  WNL WNL  Hip extension    Hip abduction    Hip adduction WNL WNL  Hip internal rotation WNL WNL  Hip external rotation WNL WNL  Knee flexion WNL WNL  Knee extension WNL WNL  Ankle dorsiflexion WNL WNL  Ankle plantarflexion WNL WNL  Ankle inversion    Ankle eversion     (Blank rows = not tested)  LOWER EXTREMITY MMT:  MMT Right eval Left eval  Hip flexion 4-/5 4-/5  Hip extension    Hip abduction 4/5 4/5  Hip adduction    Hip internal rotation 5/5 5/5  Hip external rotation 5/5 5/5  Knee flexion 4/5 4/5  Knee  extension 4/5 4/5  Ankle dorsiflexion 5/5 5/5  Ankle plantarflexion    Ankle inversion    Ankle eversion     (Blank rows = not tested)  FUNCTIONAL TESTS:  Berg Balance Scale: 47/56  GAIT: Distance walked: 60 ft. Assistive device utilized: None Level of assistance: Complete Independence Comments: Pt. Demonstrated decreased step cadence in order to maintain balance without an assistive device  -Berg balance test: 51/56 (improvement)  12/28/22.    TODAY'S TREATMENT:                                                                                                                              DATE: 01/04/2023    Subjective: Pt. Reports no recent LOB or falls.  Pts. Wife reports pt. Remains active with yard work.  Pt. Is leaving for [place] on 4/1 for 2 weeks.  No c/o knee pain today.     There. Ex:    -NuStep seat 7 with B UE/LE L5. 12 min.  Warm-up before there.ex./ balance tasks.  Discussed upcoming trip to [place].    -5# ankle wts.: marching/ lateral walking/ knee flexion with added walking.  3 laps in //-bars with mirror feedback.  No LOB and min. To no UE assist.   -Recip. ups/downs with no UE assist on 4 stairs x 4 with focus on descending/ eccentric quad control.    Neuro. Mm:    -BOSU step ups/ down with min. To no UE assist.  -Resisted gait 2BTB 5x all 4-planes with no UE assist.   -Rocker board wt. Shifting (forward/ backwards)- improved control.   -tandem stance/ gait in //-bars (no UE assist).    -walking in clinic/ outside (hills/ grassy terrain) with no gait belt.  No cuing needed today to increase step pattern/ heel strike.  Walking on grassy terrain/ up and down curbs with no LOB.    PATIENT EDUCATION:  Education details: Pt. Educated on how weakness of LE muscles can effect balance and the importance of strengthening specific muscle groups. Pt. Educated on HEP and frequency of each exercise. Pt. Educated on length of recommended PT treatment for maximum Pt.  Benefit. Pt. Educated on the benefits of balance training and the importance of balance to remain independent and prevent fall risk.  Person  educated: Patient and Spouse Education method: Explanation, Demonstration, Verbal cues, and Handouts Education comprehension: verbalized understanding  HOME EXERCISE PROGRAM: Access Code: [name] URL: [email or web address] Date: 11/18/2022 Prepared by: Dorcas Carrow Exercises - Standing with Head Rotation - 1 x daily - 7 x weekly - 3 sets - 10 reps - Standing March with Counter Support - 1 x daily - 7 x weekly - 3 sets - 10 reps - Standing Hip Abduction with Counter Support - 1 x daily - 7 x weekly - 2 sets - 10 reps   ASSESSMENT:  CLINICAL IMPRESSION: Pt. Is accompanied to PT with his wife due to having severe hearing loss.  Marked improvement in tandem gait in //-bars with no UE assist but extra time required.  No c/o LE muscle fatigue during there.ex. and pt. motivated to remain independent and prevent any further falls from occurring. PT cued pt. To increase arm swing to promote a balanced/more efficient gait. Pt. Is already an active individual and has good rehab potential with no serious co morbidities limiting his progression. Pt. Would benefit from skilled physical therapy to help him remain independent in his ADL's and prevent any future falls/avoid serious injury.  OBJECTIVE IMPAIRMENTS: Abnormal gait, decreased balance, decreased coordination, decreased endurance, decreased knowledge of condition, difficulty walking, and decreased strength.   ACTIVITY LIMITATIONS: bending, standing, stairs, locomotion level, and maintaining balance.  PARTICIPATION LIMITATIONS: driving, community activity, and yard work  PERSONAL FACTORS: Age and Time since onset of injury/illness/exacerbation are also affecting patient's functional outcome.   REHAB POTENTIAL: Good  CLINICAL DECISION MAKING: Stable/uncomplicated  EVALUATION COMPLEXITY:  Low   GOALS: Goals reviewed with patient? Yes  SHORT TERM GOALS: Target date: 12/09/2022 Pt. Independent with HEP to increase B hip strength 1/2 muscle grade to improve overall functional mobility. Baseline: 4/5 MMT (see above).  3/20: B knee extension/ flexion 5/5 MMT, hip flexion/abduction 4+/5 MMT.  (Marked improvement).   Goal status: Goal met   LONG TERM GOALS: Target date: 01/11/2023  Pt. Will increase FOTO to 67 to improve functional mobility and decrease fall risk.  Baseline: 57.  3/11: 80 Goal status: Goal met  2.  Pt. Will increase BERG balance score to >51/56 to decrease fall risk and prevent major injury/hospital stays. Baseline: 47/56.  3/18: 51/56 (marked improvement).   Goal status: Partially met  3.  Pt. Will ambulate with proper use of SPC with consistent 2-point gait pattern and no loss of balance on uneven/ grassy outdoor surfaces to improve independence in community.  Baseline: SPC Goal status: Partially met   PLAN:  PT FREQUENCY: 2x/week  PT DURATION: 1 week  PLANNED INTERVENTIONS: Therapeutic exercises, Therapeutic activity, Neuromuscular re-education, Balance training, Gait training, Patient/Family education, Self Care, Joint mobilization, Joint manipulation, Stair training, Cryotherapy, Moist heat, and Manual therapy  PLAN FOR NEXT SESSION: Discharge next tx. session  Pura Spice, PT, DPT # [phone] 01/04/2023, 7:24 PM

## 2023-01-04 NOTE — Telephone Encounter (Signed)
Advised patient's wife of results. Will refer to Dr Onalee Hua for Mohs/hd

## 2023-01-06 ENCOUNTER — Encounter: Payer: Self-pay | Admitting: Physical Therapy

## 2023-01-06 ENCOUNTER — Other Ambulatory Visit: Payer: Self-pay

## 2023-01-06 ENCOUNTER — Ambulatory Visit: Payer: Medicare Other | Admitting: Physical Therapy

## 2023-01-06 DIAGNOSIS — R29898 Other symptoms and signs involving the musculoskeletal system: Secondary | ICD-10-CM

## 2023-01-06 DIAGNOSIS — R2689 Other abnormalities of gait and mobility: Secondary | ICD-10-CM

## 2023-01-06 DIAGNOSIS — R269 Unspecified abnormalities of gait and mobility: Secondary | ICD-10-CM

## 2023-01-06 DIAGNOSIS — D4819 Other specified neoplasm of uncertain behavior of connective and other soft tissue: Secondary | ICD-10-CM

## 2023-01-06 NOTE — Therapy (Signed)
OUTPATIENT PHYSICAL THERAPY LOWER EXTREMITY TREATMENT/DISCHARGE  Patient Name: Brandon Barrett "[name]" MRN: [id number] DOB:05-Jan-1936, 87 y.o., male Today's Date: 01/06/2023  END OF SESSION:  PT End of Session - 01/06/23 0857     Visit Number 14    Number of Visits 14    Date for PT Re-Evaluation 01/11/23    PT Start Time [id number]    Equipment Utilized During Treatment Gait belt    Activity Tolerance Patient tolerated treatment well    Behavior During Therapy [facility] for tasks assessed/performed            [id number] to 0948  (53 minutes)  Past Medical History:  Diagnosis Date   Basal cell carcinoma 12/13/2013   left inferior forehead    Basal cell carcinoma 07/03/2014   left temple zygoma    Basal cell carcinoma 12/13/2013   left forehead    Basal cell carcinoma 01/21/2015   R mid back paraspinal    BCC (basal cell carcinoma) 09/28/2022   left forehead 2 cm above brow, tx'd with EDC   Skin cancer 12/23/2022   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   PCP: [name], MD    REFERRING PROVIDER: [name], MD   REFERRING DIAG: Other abnormalities of gait and mobility   THERAPY DIAG:  Gait difficulty  Weakness of both lower extremities  Balance disorder  Rationale for Evaluation and Treatment: Rehabilitation  ONSET DATE: 06/2022  SUBJECTIVE:   SUBJECTIVE STATEMENT:  EVALUATION Pts. Wife reports pt. Is deaf in L ear and limited hearing in R ear. Pt.  Presents to PT with balance issues. PT. Has had balance issues about 6 months. Pt. Has issues maintaining balance especially when bending, steps, and maintaining standing position.   PERTINENT HISTORY: Primary hypertension  Type 2 diabetes mellitus with hyperglycemia, without long-term current use of insulin (CMS-HCC)  Bilateral hearing loss, unspecified hearing loss type  Low vitamin B12 level  Hx. of seizures(controlled)  See MD notes for more.  PAIN:  Are you having pain? No  PRECAUTIONS: Fall  WEIGHT BEARING RESTRICTIONS: No  FALLS:  Has patient fallen in last 6 months? Yes. Number of falls 10+, no major injuries.   LIVING ENVIRONMENT: Lives with: lives with their family and lives with their spouse Lives in: House/apartment Stairs: Yes: Internal: 12 steps; on right going up and External: 3 steps; on right going up Has following equipment at home: Single point cane  OCCUPATION: Retired   PLOF: Independent with basic ADLs  PATIENT GOALS: Prevent falls, increase balance    NEXT MD VISIT: TBD  OBJECTIVE:   PATIENT SURVEYS:  FOTO 57/67  COGNITION: Overall cognitive status: Within functional limits for tasks assessed     SENSATION: WFL  MUSCLE LENGTH: Hamstrings: TBD Thomas test: TBD  POSTURE: No Significant postural limitations  LOWER EXTREMITY ROM:  Active ROM Right eval Left eval  Hip flexion WNL WNL  Hip extension    Hip abduction  Hip adduction WNL WNL  Hip internal rotation WNL WNL  Hip external rotation WNL WNL  Knee flexion WNL WNL  Knee extension WNL WNL  Ankle dorsiflexion WNL WNL  Ankle plantarflexion WNL WNL  Ankle inversion    Ankle eversion     (Blank rows = not tested)  LOWER EXTREMITY MMT:  MMT Right eval Left eval  Hip flexion 4-/5 4-/5  Hip extension    Hip abduction 4/5 4/5  Hip adduction    Hip internal rotation 5/5 5/5  Hip external rotation 5/5 5/5  Knee flexion 4/5 4/5  Knee extension 4/5 4/5  Ankle dorsiflexion  5/5 5/5  Ankle plantarflexion    Ankle inversion    Ankle eversion     (Blank rows = not tested)  FUNCTIONAL TESTS:  Berg Balance Scale: 47/56  GAIT: Distance walked: 60 ft. Assistive device utilized: None Level of assistance: Complete Independence Comments: Pt. Demonstrated decreased step cadence in order to maintain balance without an assistive device  -Berg balance test: 51/56 (improvement)  12/28/22.    TODAY'S TREATMENT:                                                                                                                              DATE: 01/06/2023    Subjective: Pt. Reports no recent LOB or falls.   Pt. Is leaving for [place] on 4/1 for 2 weeks.  No c/o knee pain today.     There. Ex:    -NuStep seat 7 with B UE/LE L5. 12 min.  Warm-up before there.ex./ balance tasks.  Discussed upcoming trip to [place].    -5# ankle wts.: marching/ lateral walking/ knee flexion with added walking.  3 laps in //-bars with mirror feedback.  No LOB and min. To no UE assist.   -Recip. ups/downs with no UE assist on 4 stairs x 4 with focus on descending/ eccentric quad control.    Neuro. Mm:    -Resisted gait 2BTB 5x all 4-planes with no UE assist.   -EC task (turning 360 degrees CW/ CCW) in //-bars.  No LOB.  NBOS with EC.  Functional reaching tasks.   -tandem stance/ gait in //-bars (no UE assist).    -walking in clinic/ outside (hills/ grassy terrain) with no gait belt.  No cuing needed today to increase step pattern/ heel strike.  Walking on grassy terrain/ up and down curbs with no LOB.    Reassessment of goals.    PATIENT EDUCATION:  Education details: Pt. Educated on how weakness of LE muscles can effect balance and the importance of strengthening specific muscle groups. Pt. Educated on HEP and frequency of each exercise. Pt. Educated on length of recommended PT treatment for maximum Pt. Benefit. Pt. Educated on the benefits of balance training and the importance of  balance to remain independent and prevent fall risk.  Person educated: Patient and Spouse Education method: Explanation, Demonstration, Verbal cues, and Handouts Education comprehension: verbalized  understanding  HOME EXERCISE PROGRAM: Access Code: [email or web address] URL: [email or web address] Date: 11/18/2022 Prepared by: Dorcas Carrow Exercises - Standing with Head Rotation - 1 x daily - 7 x weekly - 3 sets - 10 reps - Standing March with Counter Support - 1 x daily - 7 x weekly - 3 sets - 10 reps - Standing Hip Abduction with Counter Support - 1 x daily - 7 x weekly - 2 sets - 10 reps   ASSESSMENT:  CLINICAL IMPRESSION: Pt. Has progressed well with stilled PT services with no recent falls.  Pt. Understanding LE strengthening program and balance tasks.  Pt. Instructed to remains compliant with daily ex./ walking and contact PT if any questions or issues.  Discharge from PT at this time.    OBJECTIVE IMPAIRMENTS: Abnormal gait, decreased balance, decreased coordination, decreased endurance, decreased knowledge of condition, difficulty walking, and decreased strength.   ACTIVITY LIMITATIONS: bending, standing, stairs, locomotion level, and maintaining balance.  PARTICIPATION LIMITATIONS: driving, community activity, and yard work  PERSONAL FACTORS: Age and Time since onset of injury/illness/exacerbation are also affecting patient's functional outcome.   REHAB POTENTIAL: Good  CLINICAL DECISION MAKING: Stable/uncomplicated  EVALUATION COMPLEXITY: Low   GOALS: Goals reviewed with patient? Yes  SHORT TERM GOALS: Target date: 12/09/2022 Pt. Independent with HEP to increase B hip strength 1/2 muscle grade to improve overall functional mobility. Baseline: 4/5 MMT (see above).  3/20: B knee extension/ flexion 5/5 MMT, hip flexion/abduction 4+/5 MMT.  (Marked improvement).   Goal status: Goal met   LONG TERM GOALS: Target date: 01/11/2023  Pt. Will increase FOTO to 67 to improve  functional mobility and decrease fall risk.  Baseline: 57.  3/11: 80 Goal status: Goal met  2.  Pt. Will increase BERG balance score to >51/56 to decrease fall risk and prevent major injury/hospital stays. Baseline: 47/56.  3/18: 51/56 (marked improvement).   Goal status: Partially met  3.  Pt. Will ambulate with proper use of SPC with consistent 2-point gait pattern and no loss of balance on uneven/ grassy outdoor surfaces to improve independence in community.  Baseline: SPC.  3/27: no SPC, no LOB Goal status: Goal met   PLAN:  PT FREQUENCY: 2x/week  PT DURATION: 1 week  PLANNED INTERVENTIONS: Therapeutic exercises, Therapeutic activity, Neuromuscular re-education, Balance training, Gait training, Patient/Family education, Self Care, Joint mobilization, Joint manipulation, Stair training, Cryotherapy, Moist heat, and Manual therapy  PLAN FOR NEXT SESSION: Discharge  Pura Spice, PT, DPT # [phone] 01/06/2023, 9:02 AM

## 2023-04-01 ENCOUNTER — Ambulatory Visit: Payer: Medicare Other | Admitting: Dermatology

## 2023-04-14 ENCOUNTER — Telehealth: Payer: Self-pay

## 2023-04-14 NOTE — Telephone Encounter (Signed)
Updated specimen tracking and history from MOHs progress notes and photos. aw ?

## 2023-05-11 ENCOUNTER — Ambulatory Visit: Payer: Medicare Other | Admitting: Dermatology

## 2023-05-11 ENCOUNTER — Encounter: Payer: Self-pay | Admitting: Dermatology

## 2023-05-11 VITALS — BP 116/57 | HR 67

## 2023-05-11 DIAGNOSIS — L570 Actinic keratosis: Secondary | ICD-10-CM | POA: Diagnosis not present

## 2023-05-11 DIAGNOSIS — L821 Other seborrheic keratosis: Secondary | ICD-10-CM | POA: Diagnosis not present

## 2023-05-11 DIAGNOSIS — C4442 Squamous cell carcinoma of skin of scalp and neck: Secondary | ICD-10-CM

## 2023-05-11 DIAGNOSIS — C44319 Basal cell carcinoma of skin of other parts of face: Secondary | ICD-10-CM

## 2023-05-11 DIAGNOSIS — D485 Neoplasm of uncertain behavior of skin: Secondary | ICD-10-CM

## 2023-05-11 DIAGNOSIS — W908XXA Exposure to other nonionizing radiation, initial encounter: Secondary | ICD-10-CM

## 2023-05-11 DIAGNOSIS — Z85831 Personal history of malignant neoplasm of soft tissue: Secondary | ICD-10-CM

## 2023-05-11 DIAGNOSIS — L578 Other skin changes due to chronic exposure to nonionizing radiation: Secondary | ICD-10-CM

## 2023-05-11 DIAGNOSIS — L820 Inflamed seborrheic keratosis: Secondary | ICD-10-CM

## 2023-05-11 DIAGNOSIS — L814 Other melanin hyperpigmentation: Secondary | ICD-10-CM

## 2023-05-11 NOTE — Progress Notes (Signed)
Follow-Up Visit   Subjective  Brandon Barrett is a 87 y.o. male who presents for the following: Irregular skin lesion that bleed.  The patient has spots, moles and lesions to be evaluated, some may be new or changing and the patient may have concern these could be cancer.  The following portions of the chart were reviewed this encounter and updated as appropriate: medications, allergies, medical history  Review of Systems:  No other skin or systemic complaints except as noted in HPI or Assessment and Plan.  Objective  Well appearing patient in no apparent distress; mood and affect are within normal limits. A focused examination was performed of the following areas: the face and scalp Relevant exam findings are noted in the Assessment and Plan.  L of mid line ant scalp 1.1 cm crusted papule.     L temple 1.5 cm crusted papule.     Scalp and face x 7 (7) Erythematous thin papules/macules with gritty scale.   Scalp and face x 3 (3) Erythematous stuck-on, waxy papule or plaque   Assessment & Plan   Neoplasm of uncertain behavior of skin (2) L of mid line ant scalp  Epidermal / dermal shaving  Lesion diameter (cm):  1.1 Informed consent: discussed and consent obtained   Timeout: patient name, date of birth, surgical site, and procedure verified   Procedure prep:  Patient was prepped and draped in usual sterile fashion Prep type:  Isopropyl alcohol Anesthesia: the lesion was anesthetized in a standard fashion   Anesthetic:  1% lidocaine w/ epinephrine 1-100,000 buffered w/ 8.4% NaHCO3 Instrument used: flexible razor blade   Hemostasis achieved with: pressure, aluminum chloride and electrodesiccation   Outcome: patient tolerated procedure well   Post-procedure details: sterile dressing applied and wound care instructions given   Dressing type: bandage and petrolatum    Destruction of lesion Complexity: extensive   Destruction method: electrodesiccation and  curettage   Informed consent: discussed and consent obtained   Timeout:  patient name, date of birth, surgical site, and procedure verified Procedure prep:  Patient was prepped and draped in usual sterile fashion Prep type:  Isopropyl alcohol Anesthesia: the lesion was anesthetized in a standard fashion   Anesthetic:  1% lidocaine w/ epinephrine 1-100,000 buffered w/ 8.4% NaHCO3 Curettage performed in three different directions: Yes   Electrodesiccation performed over the curetted area: Yes   Lesion length (cm):  1.1 Lesion width (cm):  1.1 Margin per side (cm):  0.2 Final wound size (cm):  1.5 Hemostasis achieved with:  pressure, aluminum chloride and electrodesiccation Outcome: patient tolerated procedure well with no complications   Post-procedure details: sterile dressing applied and wound care instructions given   Dressing type: bandage and petrolatum    Specimen 1 - Surgical pathology Differential Diagnosis: D48.5 r/o BCC ED&C today  Check Margins: No  L temple  Epidermal / dermal shaving  Lesion diameter (cm):  1.5 Informed consent: discussed and consent obtained   Timeout: patient name, date of birth, surgical site, and procedure verified   Procedure prep:  Patient was prepped and draped in usual sterile fashion Prep type:  Isopropyl alcohol Anesthesia: the lesion was anesthetized in a standard fashion   Anesthetic:  1% lidocaine w/ epinephrine 1-100,000 buffered w/ 8.4% NaHCO3 Instrument used: flexible razor blade   Hemostasis achieved with: pressure, aluminum chloride and electrodesiccation   Outcome: patient tolerated procedure well   Post-procedure details: sterile dressing applied and wound care instructions given   Dressing type: bandage and  petrolatum    Destruction of lesion Complexity: extensive   Destruction method: electrodesiccation and curettage   Informed consent: discussed and consent obtained   Timeout:  patient name, date of birth, surgical site,  and procedure verified Procedure prep:  Patient was prepped and draped in usual sterile fashion Prep type:  Isopropyl alcohol Anesthesia: the lesion was anesthetized in a standard fashion   Anesthetic:  1% lidocaine w/ epinephrine 1-100,000 buffered w/ 8.4% NaHCO3 Curettage performed in three different directions: Yes   Electrodesiccation performed over the curetted area: Yes   Lesion length (cm):  1.5 Lesion width (cm):  1.5 Margin per side (cm):  0.2 Final wound size (cm):  1.9 Hemostasis achieved with:  pressure, aluminum chloride and electrodesiccation Outcome: patient tolerated procedure well with no complications   Post-procedure details: sterile dressing applied and wound care instructions given   Dressing type: bandage and petrolatum    Specimen 2 - Surgical pathology Differential Diagnosis: D48.5 r/o BCC ED&C today  Check Margins: No  AK (actinic keratosis) (7) Scalp and face x 7  Actinic keratoses are precancerous spots that appear secondary to cumulative UV radiation exposure/sun exposure over time. They are chronic with expected duration over 1 year. A portion of actinic keratoses will progress to squamous cell carcinoma of the skin. It is not possible to reliably predict which spots will progress to skin cancer and so treatment is recommended to prevent development of skin cancer.  Recommend daily broad spectrum sunscreen SPF 30+ to sun-exposed areas, reapply every 2 hours as needed.  Recommend staying in the shade or wearing long sleeves, sun glasses (UVA+UVB protection) and wide brim hats (4-inch brim around the entire circumference of the hat). Call for new or changing lesions.   Destruction of lesion - Scalp and face x 7 (7) Complexity: simple   Destruction method: cryotherapy   Informed consent: discussed and consent obtained   Timeout:  patient name, date of birth, surgical site, and procedure verified Lesion destroyed using liquid nitrogen: Yes   Region frozen  until ice ball extended beyond lesion: Yes   Outcome: patient tolerated procedure well with no complications   Post-procedure details: wound care instructions given    Inflamed seborrheic keratosis (3) Scalp and face x 3  Symptomatic, irritating, patient would like treated.   Destruction of lesion - Scalp and face x 3 (3) Complexity: simple   Destruction method: cryotherapy   Informed consent: discussed and consent obtained   Timeout:  patient name, date of birth, surgical site, and procedure verified Lesion destroyed using liquid nitrogen: Yes   Region frozen until ice ball extended beyond lesion: Yes   Outcome: patient tolerated procedure well with no complications   Post-procedure details: wound care instructions given    Actinic skin damage  ACTINIC DAMAGE - chronic, secondary to cumulative UV radiation exposure/sun exposure over time - diffuse scaly erythematous macules with underlying dyspigmentation - Recommend daily broad spectrum sunscreen SPF 30+ to sun-exposed areas, reapply every 2 hours as needed.  - Recommend staying in the shade or wearing long sleeves, sun glasses (UVA+UVB protection) and wide brim hats (4-inch brim around the entire circumference of the hat). - Call for new or changing lesions.  SEBORRHEIC KERATOSIS - Stuck-on, waxy, tan-brown papules and/or plaques  - Benign-appearing - Discussed benign etiology and prognosis. - Observe - Call for any changes  LENTIGINES Exam: scattered tan macules Due to sun exposure Treatment Plan: Benign-appearing, observe. Recommend daily broad spectrum sunscreen SPF 30+ to sun-exposed areas,  reapply every 2 hours as needed.  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 Observe for recurrence.  - no lymphadenopathy - Call clinic for new or changing lesions.   - Recommend regular skin exams, daily broad-spectrum spf 30+ sunscreen use, and photoprotection.     Return  for appointment as scheduled.  Maylene Roes, CMA, am acting as scribe for Armida Sans, MD .  Documentation: I have reviewed the above documentation for accuracy and completeness, and I agree with the above.  Armida Sans, MD

## 2023-05-11 NOTE — Patient Instructions (Addendum)
Wound Care Instructions  Cleanse wound gently with soap and water once a day then pat dry with clean gauze. Apply a thin coat of Petrolatum (petroleum jelly, "Vaseline") over the wound (unless you have an allergy to this). We recommend that you use a new, sterile tube of Vaseline. Do not pick or remove scabs. Do not remove the yellow or white "healing tissue" from the base of the wound.  Cover the wound with fresh, clean, nonstick gauze and secure with paper tape. You may use Band-Aids in place of gauze and tape if the wound is small enough, but would recommend trimming much of the tape off as there is often too much. Sometimes Band-Aids can irritate the skin.  You should call the office for your biopsy report after 1 week if you have not already been contacted.  If you experience any problems, such as abnormal amounts of bleeding, swelling, significant bruising, significant pain, or evidence of infection, please call the office immediately.  FOR ADULT SURGERY PATIENTS: If you need something for pain relief you may take 1 extra strength Tylenol (acetaminophen) AND 2 Ibuprofen (200mg each) together every 4 hours as needed for pain. (do not take these if you are allergic to them or if you have a reason you should not take them.) Typically, you may only need pain medication for 1 to 3 days.     Due to recent changes in healthcare laws, you may see results of your pathology and/or laboratory studies on MyChart before the doctors have had a chance to review them. We understand that in some cases there may be results that are confusing or concerning to you. Please understand that not all results are received at the same time and often the doctors may need to interpret multiple results in order to provide you with the best plan of care or course of treatment. Therefore, we ask that you please give us 2 business days to thoroughly review all your results before contacting the office for clarification. Should  we see a critical lab result, you will be contacted sooner.   If You Need Anything After Your Visit  If you have any questions or concerns for your doctor, please call our main line at [phone] and press option 4 to reach your doctor's medical assistant. If no one answers, please leave a voicemail as directed and we will return your call as soon as possible. Messages left after 4 pm will be answered the following business day.   You may also send us a message via MyChart. We typically respond to MyChart messages within 1-2 business days.  For prescription refills, please ask your pharmacy to contact our office. Our fax number is [phone].  If you have an urgent issue when the clinic is closed that cannot wait until the next business day, you can page your doctor at the number below.    Please note that while we do our best to be available for urgent issues outside of office hours, we are not available 24/7.   If you have an urgent issue and are unable to reach us, you may choose to seek medical care at your doctor's office, retail clinic, urgent care center, or emergency room.  If you have a medical emergency, please immediately call 911 or go to the emergency department.  Pager Numbers  - Dr. Kowalski: [phone]  - Dr. Moye: [phone]  - Dr. Stewart: [phone]  In the event of inclement weather, please call our main line at   [phone] for an update on the status of any delays or closures.  Dermatology Medication Tips: Please keep the boxes that topical medications come in in order to help keep track of the instructions about where and how to use these. Pharmacies typically print the medication instructions only on the boxes and not directly on the medication tubes.   If your medication is too expensive, please contact our office at [phone] option 4 or send us a message through MyChart.   We are unable to tell what your co-pay for medications will be in  advance as this is different depending on your insurance coverage. However, we may be able to find a substitute medication at lower cost or fill out paperwork to get insurance to cover a needed medication.   If a prior authorization is required to get your medication covered by your insurance company, please allow us 1-2 business days to complete this process.  Drug prices often vary depending on where the prescription is filled and some pharmacies may offer cheaper prices.  The website www.goodrx.com contains coupons for medications through different pharmacies. The prices here do not account for what the cost may be with help from insurance (it may be cheaper with your insurance), but the website can give you the price if you did not use any insurance.  - You can print the associated coupon and take it with your prescription to the pharmacy.  - You may also stop by our office during regular business hours and pick up a GoodRx coupon card.  - If you need your prescription sent electronically to a different pharmacy, notify our office through [facility] MyChart or by phone at [phone] option 4.     Si Usted Necesita Algo Despus de Su Visita  Tambin puede enviarnos un mensaje a travs de MyChart. Por lo general respondemos a los mensajes de MyChart en el transcurso de 1 a 2 das hbiles.  Para renovar recetas, por favor pida a su farmacia que se ponga en contacto con nuestra oficina. Nuestro nmero de fax es el [phone].  Si tiene un asunto urgente cuando la clnica est cerrada y que no puede esperar hasta el siguiente da hbil, puede llamar/localizar a su doctor(a) al nmero que aparece a continuacin.   Por favor, tenga en cuenta que aunque hacemos todo lo posible para estar disponibles para asuntos urgentes fuera del horario de oficina, no estamos disponibles las 24 horas del da, los 7 das de la semana.   Si tiene un problema urgente y no puede comunicarse con nosotros, puede  optar por buscar atencin mdica  en el consultorio de su doctor(a), en una clnica privada, en un centro de atencin urgente o en una sala de emergencias.  Si tiene una emergencia mdica, por favor llame inmediatamente al 911 o vaya a la sala de emergencias.  Nmeros de bper  - Dr. Kowalski: [phone]  - Dra. Moye: [phone]  - Dra. Stewart: [phone]  En caso de inclemencias del tiempo, por favor llame a nuestra lnea principal al [phone] para una actualizacin sobre el estado de cualquier retraso o cierre.  Consejos para la medicacin en dermatologa: Por favor, guarde las cajas en las que vienen los medicamentos de uso tpico para ayudarle a seguir las instrucciones sobre dnde y cmo usarlos. Las farmacias generalmente imprimen las instrucciones del medicamento slo en las cajas y no directamente en los tubos del medicamento.   Si su medicamento es muy caro, por favor, pngase en contacto con   nuestra oficina llamando al [phone] y presione la opcin 4 o envenos un mensaje a travs de MyChart.   No podemos decirle cul ser su copago por los medicamentos por adelantado ya que esto es diferente dependiendo de la cobertura de su seguro. Sin embargo, es posible que podamos encontrar un medicamento sustituto a menor costo o llenar un formulario para que el seguro cubra el medicamento que se considera necesario.   Si se requiere una autorizacin previa para que su compaa de seguros cubra su medicamento, por favor permtanos de 1 a 2 das hbiles para completar este proceso.  Los precios de los medicamentos varan con frecuencia dependiendo del lugar de dnde se surte la receta y alguna farmacias pueden ofrecer precios ms baratos.  El sitio web www.goodrx.com tiene cupones para medicamentos de diferentes farmacias. Los precios aqu no tienen en cuenta lo que podra costar con la ayuda del seguro (puede ser ms barato con su seguro), pero el sitio web puede darle el  precio si no utiliz ningn seguro.  - Puede imprimir el cupn correspondiente y llevarlo con su receta a la farmacia.  - Tambin puede pasar por nuestra oficina durante el horario de atencin regular y recoger una tarjeta de cupones de GoodRx.  - Si necesita que su receta se enve electrnicamente a una farmacia diferente, informe a nuestra oficina a travs de MyChart de [place] o por telfono llamando al [phone] y presione la opcin 4.  

## 2023-05-12 ENCOUNTER — Ambulatory Visit: Payer: Medicare Other | Admitting: Dermatology

## 2023-05-19 ENCOUNTER — Telehealth: Payer: Self-pay

## 2023-05-19 NOTE — Telephone Encounter (Signed)
Discussed pathology results with patient's wife since he is hard of hearing. She verbalized understanding and had no further questions.

## 2023-05-19 NOTE — Telephone Encounter (Signed)
-----   Message from Armida Sans sent at 05/18/2023  9:15 AM EDT ----- Diagnosis 1. Skin , left of mid line ant scalp MODERATELY DIFFERENTIATED SQUAMOUS CELL CARCINOMA 2. Skin , left temple BASAL CELL CARCINOMA, NODULAR AND INFILTRATIVE PATTERNS  1 = Cancer - SCC Already treated Recheck next visit 2= Cancer - BCC Already treated  Recheck next visit

## 2023-06-22 ENCOUNTER — Ambulatory Visit: Payer: Medicare Other | Admitting: Dermatology

## 2023-06-22 VITALS — BP 120/64

## 2023-06-22 DIAGNOSIS — L821 Other seborrheic keratosis: Secondary | ICD-10-CM

## 2023-06-22 DIAGNOSIS — D1801 Hemangioma of skin and subcutaneous tissue: Secondary | ICD-10-CM

## 2023-06-22 DIAGNOSIS — L570 Actinic keratosis: Secondary | ICD-10-CM | POA: Diagnosis not present

## 2023-06-22 DIAGNOSIS — D492 Neoplasm of unspecified behavior of bone, soft tissue, and skin: Secondary | ICD-10-CM | POA: Diagnosis not present

## 2023-06-22 DIAGNOSIS — C4442 Squamous cell carcinoma of skin of scalp and neck: Secondary | ICD-10-CM

## 2023-06-22 DIAGNOSIS — W908XXA Exposure to other nonionizing radiation, initial encounter: Secondary | ICD-10-CM

## 2023-06-22 DIAGNOSIS — Z85828 Personal history of other malignant neoplasm of skin: Secondary | ICD-10-CM

## 2023-06-22 DIAGNOSIS — Z872 Personal history of diseases of the skin and subcutaneous tissue: Secondary | ICD-10-CM

## 2023-06-22 DIAGNOSIS — Z8589 Personal history of malignant neoplasm of other organs and systems: Secondary | ICD-10-CM

## 2023-06-22 DIAGNOSIS — Z1283 Encounter for screening for malignant neoplasm of skin: Secondary | ICD-10-CM

## 2023-06-22 DIAGNOSIS — L578 Other skin changes due to chronic exposure to nonionizing radiation: Secondary | ICD-10-CM

## 2023-06-22 DIAGNOSIS — L814 Other melanin hyperpigmentation: Secondary | ICD-10-CM

## 2023-06-22 DIAGNOSIS — D229 Melanocytic nevi, unspecified: Secondary | ICD-10-CM

## 2023-06-22 NOTE — Patient Instructions (Addendum)
 Cryotherapy Aftercare  Wash gently with soap and water everyday.   Apply Vaseline and Band-Aid daily until healed.       Wound Care Instructions  Cleanse wound gently with soap and water once a day then pat dry with clean gauze. Apply a thin coat of Petrolatum (petroleum jelly, "Vaseline") over the wound (unless you have an allergy to this). We recommend that you use a new, sterile tube of Vaseline. Do not pick or remove scabs. Do not remove the yellow or white "healing tissue" from the base of the wound.  Cover the wound with fresh, clean, nonstick gauze and secure with paper tape. You may use Band-Aids in place of gauze and tape if the wound is small enough, but would recommend trimming much of the tape off as there is often too much. Sometimes Band-Aids can irritate the skin.  You should call the office for your biopsy report after 1 week if you have not already been contacted.  If you experience any problems, such as abnormal amounts of bleeding, swelling, significant bruising, significant pain, or evidence of infection, please call the office immediately.  FOR ADULT SURGERY PATIENTS: If you need something for pain relief you may take 1 extra strength Tylenol (acetaminophen) AND 2 Ibuprofen (200mg  each) together every 4 hours as needed for pain. (do not take these if you are allergic to them or if you have a reason you should not take them.) Typically, you may only need pain medication for 1 to 3 days.          Due to recent changes in healthcare laws, you may see results of your pathology and/or laboratory studies on MyChart before the doctors have had a chance to review them. We understand that in some cases there may be results that are confusing or concerning to you. Please understand that not all results are received at the same time and often the doctors may need to interpret multiple results in order to provide you with the best plan of care or course of treatment. Therefore,  we ask that you please give [place] 2 business days to thoroughly review all your results before contacting the office for clarification. Should we see a critical lab result, you will be contacted sooner.   If You Need Anything After Your Visit  If you have any questions or concerns for your doctor, please call our main line at [phone] and press option 4 to reach your doctor's medical assistant. If no one answers, please leave a voicemail as directed and we will return your call as soon as possible. Messages left after 4 pm will be answered the following business day.   You may also send [place] a message via MyChart. We typically respond to MyChart messages within 1-2 business days.  For prescription refills, please ask your pharmacy to contact our office. Our fax number is [phone].  If you have an urgent issue when the clinic is closed that cannot wait until the next business day, you can page your doctor at the number below.    Please note that while we do our best to be available for urgent issues outside of office hours, we are not available 24/7.   If you have an urgent issue and are unable to reach [place], you may choose to seek medical care at your doctor's office, retail clinic, urgent care center, or emergency room.  If you have a medical emergency, please immediately call 911 or go to the emergency department.  Pager Numbers  - Dr. Gwen Pounds: [phone]  - Dr. Roseanne Reno: [phone]  - Dr. Katrinka Blazing: [phone]   In the event of inclement weather, please call our main line at [phone] for an update on the status of any delays or closures.  Dermatology Medication Tips: Please keep the boxes that topical medications come in in order to help keep track of the instructions about where and how to use these. Pharmacies typically print the medication instructions only on the boxes and not directly on the medication tubes.   If your medication is too expensive, please contact our  office at [phone] option 4 or send [place] a message through MyChart.   We are unable to tell what your co-pay for medications will be in advance as this is different depending on your insurance coverage. However, we may be able to find a substitute medication at lower cost or fill out paperwork to get insurance to cover a needed medication.   If a prior authorization is required to get your medication covered by your insurance company, please allow [place] 1-2 business days to complete this process.  Drug prices often vary depending on where the prescription is filled and some pharmacies may offer cheaper prices.  The website www.goodrx.com contains coupons for medications through different pharmacies. The prices here do not account for what the cost may be with help from insurance (it may be cheaper with your insurance), but the website can give you the price if you did not use any insurance.  - You can print the associated coupon and take it with your prescription to the pharmacy.  - You may also stop by our office during regular business hours and pick up a GoodRx coupon card.  - If you need your prescription sent electronically to a different pharmacy, notify our office through [facility] or by phone at [phone] option 4.     Si Usted Necesita Algo Despus de Su Visita  Tambin puede enviarnos un mensaje a travs de Clinical cytogeneticist. Por lo general respondemos a los mensajes de MyChart en el transcurso de 1 a 2 das hbiles.  Para renovar recetas, por favor pida a su farmacia que se ponga en contacto con nuestra oficina. Annie Sable de fax es [place] [phone].  Si tiene un asunto urgente cuando la clnica est cerrada y que no puede esperar hasta el siguiente da hbil, puede llamar/localizar a su doctor(a) al nmero que aparece a continuacin.   Por favor, tenga en cuenta que aunque hacemos todo lo posible para estar disponibles para asuntos urgentes fuera del horario de [place], no  estamos disponibles las 24 horas del da, los 7 [address]  [address] de la [place].   Si tiene un problema urgente y no puede comunicarse con nosotros, puede optar por buscar atencin mdica  en el consultorio de su doctor(a), en una clnica privada, en un centro de atencin urgente o en una sala de emergencias.  Si tiene Engineer, drilling, por favor llame inmediatamente al 911 o vaya a la sala de emergencias.  Nmeros de bper  - Dr. Gwen Pounds: [phone]  - Dra. Roseanne Reno: [phone]  - Dr. Katrinka Blazing: [phone]   En caso de inclemencias del tiempo, por favor llame a Lacy Duverney principal al [phone] para una actualizacin sobre el [place] de cualquier retraso o cierre.  Consejos para la medicacin en dermatologa: Por favor, guarde las cajas en las que vienen los medicamentos de uso tpico para ayudarle a seguir las instrucciones sobre dnde y cmo usarlos. [employer]  generalmente imprimen las instrucciones del medicamento slo en las cajas y no directamente en los tubos del [place].   Si su medicamento es muy caro, por favor, pngase en contacto con Rolm Gala llamando al [phone] y presione la opcin 4 o envenos un mensaje a travs de Clinical cytogeneticist.   No podemos decirle cul ser su copago por los medicamentos por adelantado ya que esto es diferente dependiendo de la cobertura de su seguro. Sin embargo, es posible que podamos encontrar un medicamento sustituto a Audiological scientist un formulario para que el seguro cubra el medicamento que se considera necesario.   Si se requiere una autorizacin previa para que su compaa de seguros [place] su medicamento, por favor permtanos de 1 a 2 das hbiles para completar [address]  Los precios de los medicamentos varan con frecuencia dependiendo del Environmental consultant de dnde se surte la receta y alguna farmacias pueden ofrecer precios ms baratos.  El sitio web www.goodrx.com tiene cupones para medicamentos de Health and safety inspector. Los precios  aqu no tienen en cuenta lo que podra costar con la ayuda del seguro (puede ser ms barato con su seguro), pero el sitio web puede darle el precio si no utiliz Tourist information centre manager.  - Puede imprimir el cupn correspondiente y llevarlo con su receta a la farmacia.  - Tambin puede pasar por nuestra oficina durante el horario de atencin regular y Education officer, museum una tarjeta de cupones de GoodRx.  - Si necesita que su receta se enve electrnicamente a una farmacia diferente, informe a nuestra oficina a travs de MyChart de [place] o por telfono llamando al [phone] y presione la opcin 4.

## 2023-06-22 NOTE — Progress Notes (Signed)
Follow-Up Visit   Subjective  Brandon Barrett is a 87 y.o. male who presents for the following: Skin Cancer Screening and Upper Body Skin Exam, hx of BCCs, SCCs, Aks, check recent SCC L of midline ant scalp, bx and EDC 05/11/23 hx of bleeding last week  Patient accompanied by wife who contributes to history.  The patient presents for Upper Body Skin Exam (UBSE) for skin cancer screening and mole check. The patient has spots, moles and lesions to be evaluated, some may be new or changing and the patient may have concern these could be cancer.    The following portions of the chart were reviewed this encounter and updated as appropriate: medications, allergies, medical history  Review of Systems:  No other skin or systemic complaints except as noted in HPI or Assessment and Plan.  Objective  Well appearing patient in no apparent distress; mood and affect are within normal limits.  All skin waist up examined. Relevant physical exam findings are noted in the Assessment and Plan.  L ear, R forehead, scalp x 10 (10) Pink scaly macules  left of mid line ant scalp 1.2cm crusted pap         Assessment & Plan   AK (actinic keratosis) (10) L ear, R forehead, scalp x 10  Actinic keratoses are precancerous spots that appear secondary to cumulative UV radiation exposure/sun exposure over time. They are chronic with expected duration over 1 year. A portion of actinic keratoses will progress to squamous cell carcinoma of the skin. It is not possible to reliably predict which spots will progress to skin cancer and so treatment is recommended to prevent development of skin cancer.  Recommend daily broad spectrum sunscreen SPF 30+ to sun-exposed areas, reapply every 2 hours as needed.  Recommend staying in the shade or wearing long sleeves, sun glasses (UVA+UVB protection) and wide brim hats (4-inch brim around the entire circumference of the hat). Call for new or changing  lesions.  Destruction of lesion - L ear, R forehead, scalp x 10 (10) Complexity: simple   Destruction method: cryotherapy   Informed consent: discussed and consent obtained   Timeout:  patient name, date of birth, surgical site, and procedure verified Lesion destroyed using liquid nitrogen: Yes   Region frozen until ice ball extended beyond lesion: Yes   Outcome: patient tolerated procedure well with no complications   Post-procedure details: wound care instructions given    Neoplasm of skin - Biopsy proven SCC  Excision today left of mid line ant scalp Skin excision  Lesion length (cm):  1.2 Lesion width (cm):  1.2 Margin per side (cm):  0.1 Total excision diameter (cm):  1.4 Informed consent: discussed and consent obtained   Timeout: patient name, date of birth, surgical site, and procedure verified   Procedure prep:  Patient was prepped and draped in usual sterile fashion Prep type:  Isopropyl alcohol and povidone-iodine Anesthesia: the lesion was anesthetized in a standard fashion   Anesthetic:  1% lidocaine w/ epinephrine 1-100,000 buffered w/ 8.4% NaHCO3 Instrument used: #15 blade   Hemostasis achieved with: pressure   Hemostasis achieved with comment:  Electrocautery Outcome: patient tolerated procedure well with no complications   Post-procedure details: sterile dressing applied and wound care instructions given   Dressing type: bandage, pressure dressing and bacitracin (Mupirocin)    Specimen 1 - Surgical pathology Differential Diagnosis: Recurrent SCC vs other  Check Margins: yes 1.2cm crusted pap, Simple excision, EDC today [id number]  Actinic skin damage  Skin cancer screening  Lentigo  Melanocytic nevus, unspecified location  Seborrheic keratosis  History of basal cell carcinoma  History of squamous cell carcinoma   Skin cancer screening performed today.  Actinic Damage - Chronic condition, secondary to cumulative UV/sun exposure - diffuse  scaly erythematous macules with underlying dyspigmentation - Recommend daily broad spectrum sunscreen SPF 30+ to sun-exposed areas, reapply every 2 hours as needed.  - Staying in the shade or wearing long sleeves, sun glasses (UVA+UVB protection) and wide brim hats (4-inch brim around the entire circumference of the hat) are also recommended for sun protection.  - Call for new or changing lesions.  Lentigines, Seborrheic Keratoses, Hemangiomas - Benign normal skin lesions - Benign-appearing - Call for any changes  Melanocytic Nevi - Tan-brown and/or pink-flesh-colored symmetric macules and papules - Benign appearing on exam today - Observation - Call clinic for new or changing moles - Recommend daily use of broad spectrum spf 30+ sunscreen to sun-exposed areas.   HISTORY OF BASAL CELL CARCINOMA OF THE SKIN - No evidence of recurrence today - Recommend regular full body skin exams - Recommend daily broad spectrum sunscreen SPF 30+ to sun-exposed areas, reapply every 2 hours as needed.  - Call if any new or changing lesions are noted between office visits  - multiple  HISTORY OF SQUAMOUS CELL CARCINOMA OF THE SKIN - No evidence of recurrence today - No lymphadenopathy - Recommend regular full body skin exams - Recommend daily broad spectrum sunscreen SPF 30+ to sun-exposed areas, reapply every 2 hours as needed.  - Call if any new or changing lesions are noted between office visits - multiple   HISTORY of ATYPICAL FIBROXANTHOMA R scalp, mohs with Dr. Adriana Simas 04/01/2023 Exam: R scalp clear with visual exam and palpation, no lymphadenopathy No Lymphadenopathy today. Treatment Plan: Keep f/u with Dr. Osvaldo Angst. Observe for recurrence.  No Lymphadenopathy. Call clinic for new or changing lesions.  Recommend regular skin exams, daily broad-spectrum spf 30+ sunscreen use, and photoprotection.      Return in about 4 months (around 10/22/2023) for TBSE, AK f/u, recheck SCC scalp.  I,  Ardis Rowan, RMA, am acting as scribe for Armida Sans, MD .   Documentation: I have reviewed the above documentation for accuracy and completeness, and I agree with the above.  Armida Sans, MD

## 2023-06-25 ENCOUNTER — Encounter: Payer: Self-pay | Admitting: Dermatology

## 2023-06-25 LAB — SURGICAL PATHOLOGY

## 2023-06-28 ENCOUNTER — Telehealth: Payer: Self-pay

## 2023-06-28 NOTE — Telephone Encounter (Signed)
Discussed pathology with patient's wife Nicole Cella.

## 2023-06-28 NOTE — Telephone Encounter (Signed)
-----   Message from Armida Sans sent at 06/25/2023  7:26 PM EDT ----- FINAL DIAGNOSIS        1. Skin, left of mid line ant scalp :       NO RESIDUAL SQUAMOUS CELL CARCINOMA, MARGINS FREE   Site of Cancer = SCC Margins clear

## 2023-09-29 ENCOUNTER — Ambulatory Visit: Payer: Medicare Other | Admitting: Dermatology

## 2023-10-29 ENCOUNTER — Other Ambulatory Visit: Payer: Self-pay | Admitting: Student

## 2023-10-29 ENCOUNTER — Encounter: Payer: Self-pay | Admitting: Student

## 2023-10-29 DIAGNOSIS — R2689 Other abnormalities of gait and mobility: Secondary | ICD-10-CM

## 2023-10-29 DIAGNOSIS — R569 Unspecified convulsions: Secondary | ICD-10-CM

## 2023-10-29 DIAGNOSIS — R4189 Other symptoms and signs involving cognitive functions and awareness: Secondary | ICD-10-CM

## 2023-11-03 ENCOUNTER — Other Ambulatory Visit: Payer: Self-pay

## 2023-11-03 ENCOUNTER — Emergency Department
Admission: EM | Admit: 2023-11-03 | Discharge: 2023-11-04 | Disposition: A | Discharge status: Home / Self Care | Payer: Medicare Other | Attending: Emergency Medicine | Admitting: Emergency Medicine

## 2023-11-03 ENCOUNTER — Emergency Department: Payer: Medicare Other

## 2023-11-03 DIAGNOSIS — Z79899 Other long term (current) drug therapy: Secondary | ICD-10-CM | POA: Insufficient documentation

## 2023-11-03 DIAGNOSIS — Z1152 Encounter for screening for COVID-19: Secondary | ICD-10-CM | POA: Insufficient documentation

## 2023-11-03 DIAGNOSIS — R440 Auditory hallucinations: Secondary | ICD-10-CM

## 2023-11-03 DIAGNOSIS — I6782 Cerebral ischemia: Secondary | ICD-10-CM | POA: Insufficient documentation

## 2023-11-03 DIAGNOSIS — F22 Delusional disorders: Secondary | ICD-10-CM | POA: Diagnosis not present

## 2023-11-03 DIAGNOSIS — Z7982 Long term (current) use of aspirin: Secondary | ICD-10-CM | POA: Insufficient documentation

## 2023-11-03 DIAGNOSIS — Z85828 Personal history of other malignant neoplasm of skin: Secondary | ICD-10-CM | POA: Insufficient documentation

## 2023-11-03 DIAGNOSIS — F062 Psychotic disorder with delusions due to known physiological condition: Secondary | ICD-10-CM

## 2023-11-03 DIAGNOSIS — J9811 Atelectasis: Secondary | ICD-10-CM | POA: Insufficient documentation

## 2023-11-03 LAB — CBC WITH DIFFERENTIAL/PLATELET
Abs Immature Granulocytes: 0.04 10*3/uL (ref 0.00–0.07)
Basophils Absolute: 0 10*3/uL (ref 0.0–0.1)
Basophils Relative: 1 %
Eosinophils Absolute: 0.4 10*3/uL (ref 0.0–0.5)
Eosinophils Relative: 4 %
HCT: 35.9 % — ABNORMAL LOW (ref 39.0–52.0)
Hemoglobin: 12 g/dL — ABNORMAL LOW (ref 13.0–17.0)
Immature Granulocytes: 1 %
Lymphocytes Relative: 26 %
Lymphs Abs: 2.2 10*3/uL (ref 0.7–4.0)
MCH: 32 pg (ref 26.0–34.0)
MCHC: 33.4 g/dL (ref 30.0–36.0)
MCV: 95.7 fL (ref 80.0–100.0)
Monocytes Absolute: 1.4 10*3/uL — ABNORMAL HIGH (ref 0.1–1.0)
Monocytes Relative: 16 %
Neutro Abs: 4.5 10*3/uL (ref 1.7–7.7)
Neutrophils Relative %: 52 %
Platelets: 176 10*3/uL (ref 150–400)
RBC: 3.75 MIL/uL — ABNORMAL LOW (ref 4.22–5.81)
RDW: 12.3 % (ref 11.5–15.5)
WBC: 8.5 10*3/uL (ref 4.0–10.5)
nRBC: 0 % (ref 0.0–0.2)

## 2023-11-03 LAB — URINE DRUG SCREEN, QUALITATIVE (ARMC ONLY)
Amphetamines, Ur Screen: NOT DETECTED
Barbiturates, Ur Screen: NOT DETECTED
Benzodiazepine, Ur Scrn: NOT DETECTED
Cannabinoid 50 Ng, Ur ~~LOC~~: NOT DETECTED
Cocaine Metabolite,Ur ~~LOC~~: NOT DETECTED
MDMA (Ecstasy)Ur Screen: NOT DETECTED
Methadone Scn, Ur: NOT DETECTED
Opiate, Ur Screen: NOT DETECTED
Phencyclidine (PCP) Ur S: NOT DETECTED
Tricyclic, Ur Screen: NOT DETECTED

## 2023-11-03 LAB — URINALYSIS, ROUTINE W REFLEX MICROSCOPIC
Bilirubin Urine: NEGATIVE
Glucose, UA: 50 mg/dL — AB
Hgb urine dipstick: NEGATIVE
Ketones, ur: NEGATIVE mg/dL
Leukocytes,Ua: NEGATIVE
Nitrite: NEGATIVE
Protein, ur: NEGATIVE mg/dL
Specific Gravity, Urine: 1.013 (ref 1.005–1.030)
pH: 5 (ref 5.0–8.0)

## 2023-11-03 LAB — COMPREHENSIVE METABOLIC PANEL
ALT: 18 U/L (ref 0–44)
AST: 21 U/L (ref 15–41)
Albumin: 3.8 g/dL (ref 3.5–5.0)
Alkaline Phosphatase: 92 U/L (ref 38–126)
Anion gap: 9 (ref 5–15)
BUN: 20 mg/dL (ref 8–23)
CO2: 19 mmol/L — ABNORMAL LOW (ref 22–32)
Calcium: 9.2 mg/dL (ref 8.9–10.3)
Chloride: 106 mmol/L (ref 98–111)
Creatinine, Ser: 1.26 mg/dL — ABNORMAL HIGH (ref 0.61–1.24)
GFR, Estimated: 56 mL/min — ABNORMAL LOW (ref >60)
Glucose, Bld: 236 mg/dL — ABNORMAL HIGH (ref 70–99)
Potassium: 4.3 mmol/L (ref 3.5–5.1)
Sodium: 134 mmol/L — ABNORMAL LOW (ref 135–145)
Total Bilirubin: 0.8 mg/dL (ref 0.0–1.2)
Total Protein: 7.1 g/dL (ref 6.5–8.1)

## 2023-11-03 LAB — ETHANOL: Alcohol, Ethyl (B): <10 mg/dL (ref <10)

## 2023-11-03 NOTE — ED Triage Notes (Signed)
Pt daughter reports pt having auditory hallucinations x2 weeks now and was sent by PCP to be evaluated for dementia. Pt is deaf but reports that people are wanting to kill him and his family. Pt had a recent increase in his Seroquel prescription.

## 2023-11-03 NOTE — ED Provider Triage Note (Signed)
Emergency Medicine Provider Triage Evaluation Note  Brandon Barrett , a 88 y.o. male  was evaluated in triage.  Pt complains of hallucinations.  Family is with patient and states that he has been hearing voice for some time but getting more frequent over the last 2 weeks. Pt is HOH.   Review of Systems  Positive: Hallucinations + seroquel RX Negative: No recent illness  Physical Exam  BP 132/65 (BP Location: Right Arm)   Pulse 67   Temp (!) 97.5 F (36.4 C) (Oral)   Resp 16   Ht 5\' 7"  (1.702 m)   Wt 74.8 kg   SpO2 95%   BMI 25.84 kg/m  Gen:   Awake, no distress  Cooperative Resp:  Normal effort   Lungs clear  MSK:   Moves extremities without difficulty  Other:    Medical Decision Making  Medically screening exam initiated at 2:49 PM.  Appropriate orders placed.  [facility] was informed that the remainder of the evaluation will be completed by another provider, this initial triage assessment does not replace that evaluation, and the importance of remaining in the ED until their evaluation is complete.     Brandon Rumps, PA-C 11/03/23 1452

## 2023-11-03 NOTE — ED Provider Notes (Signed)
[facility] Provider Note    Event Date/Time   First MD Initiated Contact with Patient 11/03/23 2301     (approximate)   History   Hallucinations   HPI  Level V caveat: Limited by Samule Ohm is a 88 y.o. male brought to the ED from home by his daughter and wife with a chief complaint of auditory hallucinations.  Family state patient has been experiencing auditory hallucinations x 2 years.  Started off as hearing music of different nationality's.  Over the past 3 months patient has been hearing voices which are disparaging to his grandson.  He was started on Seroquel first 12.5 mg, now 25 mg which seem to have made him symptoms worse.  He has started wandering away from home.  Family has found patient in the yard yelling at the air.  Patient states the voices escalate and become angry and threatening.  Denies active SI/HI/VH.  Daughter is concerned patient may wander away from home and harm himself.  Also notes patient is paranoid.  Denies recent fever/chills, chest pain, shortness of breath, abdominal pain, nausea, vomiting or dizziness.     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 04/01/23   Squamous cell carcinoma of skin 11/24/2013   right cheek    Squamous cell carcinoma of skin 09/22/2007   L parietal scalp    Squamous cell carcinoma of skin 09/22/2021   L ear - ED&C   Squamous cell carcinoma of skin 05/11/2023   L of mid line ant scalp - ED&C   Squamous cell carcinoma of skin  06/22/2023   left of mid line ant scalp - margins free     Active Problem List   Patient Active Problem List   Diagnosis Date Noted   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 Authorizing Provider  QUEtiapine (SEROQUEL) 25 MG tablet Take 0.5 tablets (12.5 mg total) by mouth daily with breakfast. 11/04/23 12/04/23 Yes Christell Constant, [place], NP  aspirin 325 MG tablet Take by mouth.    [provider]  atorvastatin (LIPITOR) 40 MG tablet Take 1 tablet by mouth daily. 04/29/21   [provider]  azelastine (ASTELIN) 0.1 % nasal spray Place into the nose.    [provider]  B Complex Vitamins (VITAMIN B COMPLEX) TABS Take 1 tablet by mouth daily.    [provider]  Cholecalciferol 25 MCG (1000 UT) tablet Take by mouth.    [provider]  cyanocobalamin 1000 MCG tablet Take by mouth. 01/05/20  [provider]  fish oil-omega-3 fatty acids 1000 MG capsule Take by mouth.    [provider]  fluticasone (FLONASE) 50 MCG/ACT nasal spray Place into the nose.    [provider]  Garlic 1000 MG CAPS Take by mouth.    [provider]  lamoTRIgine (LAMICTAL) 100 MG tablet Take 100 mg by mouth 2 (two) times daily. 09/08/21   [provider]  levothyroxine (SYNTHROID) 25 MCG tablet TAKE 1 TABLET ONCE DAILY TAKE ON AN EMPTY STOMACH WITH A GLASS OF WATER AT LEAST 30 TO 60 MINUTES BEFORE BREAKFAST 06/04/21   [provider]  metFORMIN (GLUCOPHAGE) 500 MG tablet Take 1 tablet by mouth 2 (two) times daily with a meal.  06/04/21   [provider]  mometasone (ELOCON) 0.1 % cream Apply to right ankle rash QD up to 5 days per week. 03/23/22   Deirdre Evener, MD  montelukast (SINGULAIR) 10 MG tablet Take by mouth. 08/13/20   [provider]  Multiple Vitamins-Minerals (CENTRUM SILVER PO) Take by mouth.    [provider]  olmesartan (BENICAR) 20 MG tablet Take 20 mg by mouth daily. 08/31/21   [provider]  oxybutynin (DITROPAN-XL) 10 MG 24 hr tablet Take 1 tablet by mouth daily. 05/13/21   [provider]  propranolol ER (INDERAL LA) 60 MG 24 hr capsule Take 60 mg by mouth daily. 08/27/21   [provider]  tamsulosin (FLOMAX) 0.4 MG CAPS capsule TAKE 1 CAPSULE DAILY 30 MINUTES AFTER THE SAME MEAL 06/04/21   [provider]     Allergies  Patient has no known allergies.   Family History  History reviewed. No pertinent family history.   Physical Exam  Triage Vital Signs: ED Triage Vitals  Encounter Vitals Group     BP 11/03/23 1440 132/65     Systolic BP Percentile --      Diastolic BP Percentile --      Pulse Rate 11/03/23 1440 67     Resp 11/03/23 1440 16     Temp 11/03/23 1440 (!) 97.5 F (36.4 C)     Temp Source 11/03/23 1440 Oral     SpO2 11/03/23 1440 95 %     Weight 11/03/23 1441 165 lb (74.8 kg)     Height 11/03/23 1441 5\' 7"  (1.702 m)     Head Circumference --      Peak Flow --      Pain Score 11/03/23 1441 0     Pain Loc --      Pain Education --      Exclude from Growth Chart --     Updated Vital Signs: BP 103/60   Pulse (!) 55   Temp 98.6 F (37 C)   Resp 16   Ht 5\' 7"  (1.702 m)   Wt 74.8 kg   SpO2 95%   BMI 25.84 kg/m    General: Awake, no distress.  CV:  RRR.  Good peripheral perfusion.  Resp:  Normal effort.  CTAB. Abd:  Nontender.  No distention.  Other:  No carotid bruits.  Alert and oriented to person and place.  CN II-XII grossly intact.  5/5 motor strength and sensation all extremities.  MAE x  4.   ED Results / Procedures / Treatments  Labs (all labs ordered are listed, but only abnormal results are displayed) Labs Reviewed  COMPREHENSIVE METABOLIC PANEL - Abnormal; Notable for the following components:      Result Value  Sodium 134 (*)    CO2 19 (*)    Glucose, Bld 236 (*)    Creatinine, Ser 1.26 (*)    GFR, Estimated 56 (*)    All other components within normal limits  CBC WITH DIFFERENTIAL/PLATELET - Abnormal; Notable for the following components:   RBC 3.75 (*)    Hemoglobin 12.0 (*)    HCT 35.9 (*)    Monocytes Absolute 1.4 (*)    All other components within normal limits  URINALYSIS, ROUTINE W REFLEX MICROSCOPIC - Abnormal; Notable for the following components:   Color, Urine YELLOW (*)    APPearance CLEAR (*)    Glucose, UA 50 (*)    All other components within normal limits  RESP PANEL BY RT-PCR (RSV, FLU A&B, COVID)  RVPGX2  ETHANOL  URINE DRUG SCREEN, QUALITATIVE ([facility] ONLY)  TROPONIN I (HIGH SENSITIVITY)     EKG  ED ECG REPORT I, Renata Gambino J, the attending physician, personally viewed and interpreted this ECG.   Date: 11/04/2023  EKG Time: 1453  Rate: 65  Rhythm: normal sinus rhythm  Axis: Normal  Intervals:none  ST&T Change: Nonspecific    RADIOLOGY I have independently visualized and interpreted patient's imaging studies as well as noted the radiology interpretation:  CT head: No ICH  Chest x-ray: Bibasilar atelectasis  Official radiology report(s): DG Chest Port 1 View Result Date: 11/04/2023 CLINICAL DATA:  Hallucinations EXAM: PORTABLE CHEST 1 VIEW COMPARISON:  06/07/2013 FINDINGS: Heart and mediastinal contours are within normal limits. Bibasilar atelectasis. No effusions or acute bony abnormality. Old healed right clavicle fracture. IMPRESSION: Bibasilar atelectasis. Electronically Signed   By: Charlett Nose M.D.   On: 11/04/2023 00:25   CT Head Wo Contrast Result Date: 11/03/2023 CLINICAL DATA:  Hearing voices. EXAM: CT HEAD  WITHOUT CONTRAST TECHNIQUE: Contiguous axial images were obtained from the base of the skull through the vertex without intravenous contrast. RADIATION DOSE REDUCTION: This exam was performed according to the departmental dose-optimization program which includes automated exposure control, adjustment of the mA and/or kV according to patient size and/or use of iterative reconstruction technique. COMPARISON:  MRI head 07/07/2022 FINDINGS: Brain: No intracranial hemorrhage, mass effect, or evidence of acute infarct. No hydrocephalus. No extra-axial fluid collection. Age-commensurate cerebral atrophy and chronic small vessel ischemic disease. Vascular: No hyperdense vessel. Intracranial arterial calcification. Skull: No fracture or focal lesion. Sinuses/Orbits: No acute finding. Other: None. IMPRESSION: No acute intracranial abnormality. Electronically Signed   By: Minerva Fester M.D.   On: 11/03/2023 23:58     PROCEDURES:  Critical Care performed: No  .1-3 Lead EKG Interpretation  Performed by: Irean Hong, MD Authorized by: Irean Hong, MD     Interpretation: normal     ECG rate:  65   ECG rate assessment: normal     Rhythm: sinus rhythm     Ectopy: none     Conduction: normal   Comments:     Patient placed on cardiac monitor to evaluate for arrhythmias    MEDICATIONS ORDERED IN ED: Medications - No data to display   IMPRESSION / MDM / ASSESSMENT AND PLAN / ED COURSE  I reviewed the triage vital signs and the nursing notes.                             88 year old male referred to the ED by his PCP for evaluation of auditory hallucinations. Differential diagnosis includes, but is not limited to, alcohol,  illicit or prescription medications, or other toxic ingestion; intracranial pathology such as stroke or intracerebral hemorrhage; fever or infectious causes including sepsis; hypoxemia and/or hypercarbia; uremia; trauma; endocrine related disorders such as diabetes, hypoglycemia, and  thyroid-related diseases; hypertensive encephalopathy; etc. I personally reviewed patient's records and note a neurology office visit on 10/21/2023 for cognitive impairment.  Patient's presentation is most consistent with acute complicated illness / injury requiring diagnostic workup.  The patient is on the cardiac monitor to evaluate for evidence of arrhythmia and/or significant heart rate changes.  Laboratory results unremarkable other than mild AKI.  Troponin negative.  Urine/UDS negative.  Patient hydrating orally.  Imaging studies negative.  Will obtain respiratory panel as part of infectious workup.  Have consulted psychiatry for evaluation.  Family remains at bedside.  Clinical Course as of 11/04/23 1660  Thu Nov 04, 2023  0253 Patient evaluated by psychiatry who has sent in prescription for increased quetiapine.  Has an MRI scheduled for Saturday.  [facility]  Strict return precautions given.  Daughter and wife verbalized understanding and agree with plan of care. [JS]  6301 Daughter went home but will be back in the morning to drive patient and his spouse home. [JS]    Clinical Course User Index [JS] Irean Hong, MD     FINAL CLINICAL IMPRESSION(S) / ED DIAGNOSES   Final diagnoses:  Auditory hallucinations     Rx / DC Orders   ED Discharge Orders          Ordered    QUEtiapine (SEROQUEL) 25 MG tablet  Daily with breakfast        11/04/23 0244             Note:  This document was prepared using Dragon voice recognition software and may include unintentional dictation errors.   Irean Hong, MD 11/04/23 [phone]

## 2023-11-03 NOTE — ED Notes (Signed)
Pt presents to ED with wife and daughter. Pt reports that he is hearing voices and they want to "kill me, hurt me, shoot me". Pt reports that these  voices are whispering. Offers at times a pleasant music is all that he can here but lately it's been more of the whispering voices. Pt denies suicidal or homicidal instructions from the voices. Wife and daughter reports hallucinations for 18+ years but that they have got so much worse. They offer that patient is frequently found yelling and cursing at objects in the house and that he has been known to run outside and yell and curse. Daughter reports that dad has told her In the past the voices want to hurt her mom, herself, and her son.

## 2023-11-04 ENCOUNTER — Encounter: Payer: Self-pay | Admitting: Psychiatry

## 2023-11-04 ENCOUNTER — Emergency Department: Payer: Medicare Other

## 2023-11-04 DIAGNOSIS — F062 Psychotic disorder with delusions due to known physiological condition: Secondary | ICD-10-CM

## 2023-11-04 LAB — TROPONIN I (HIGH SENSITIVITY): Troponin I (High Sensitivity): 4 ng/L (ref <18)

## 2023-11-04 LAB — RESP PANEL BY RT-PCR (RSV, FLU A&B, COVID)  RVPGX2
Influenza A by PCR: NEGATIVE
Influenza B by PCR: NEGATIVE
Resp Syncytial Virus by PCR: NEGATIVE
SARS Coronavirus 2 by RT PCR: NEGATIVE

## 2023-11-04 MED ORDER — QUETIAPINE FUMARATE 25 MG PO TABS: 12.5000 mg | ORAL_TABLET | Freq: Every day | ORAL | 0 refills | Status: AC

## 2023-11-04 NOTE — ED Notes (Signed)
Discharge instructions provided to wife. Wife verbalized understanding and denied questions. Daughter is on her way to take patient and wife home and they will let staff know when she is here

## 2023-11-04 NOTE — ED Notes (Signed)
Pt to be held until daughter is able to come and get patient and spouse per Dr. Dolores Frame

## 2023-11-04 NOTE — Discharge Instructions (Signed)
You have been seen by psychiatry who has sent a prescription to your pharmacy. Return to the ER for worsening symptoms, persistent vomiting, difficulty breathing, feelings of hurting yourself or others, or other concerns.

## 2023-11-04 NOTE — BH Assessment (Signed)
Comprehensive Clinical Assessment (CCA) Note  11/04/2023 Thelma M[facility] [id number]  Chief Complaint: Patient is a 88 year old male presenting to [facility] ED voluntarily. Per triage note Pt daughter reports pt having auditory hallucinations x2 weeks now and was sent by PCP to be evaluated for dementia. Pt is deaf but reports that people are wanting to kill him and his family. Pt had a recent increase in his Seroquel prescription. During assessment patient appears alert and oriented x4, calm and cooperative, patient is presenting with his wife. Patient's wife reports "the doctor, his Neurologist wanted [place] to come here, they want [place] to change his medications, his Neurologist is Dr. Clelia Croft."  Patient's wife reports that his doctor changes his medication to Zoloft 3 months ago and increased the dosage to 50mg , wife reports since then the patient's voices have increased. 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 Patient reports despite the hallucinations he is able to sleep and has no change in his appetite. Patient denies SI/HI. Chief Complaint  Patient presents with   Hallucinations   Visit Diagnosis: Hallucinations     CCA Screening, Triage and Referral (STR)  Patient Reported Information How did you hear about [place]? Family/Friend  Referral name: No data recorded Referral phone number: No data recorded  Whom do you see for routine medical problems? No data recorded Practice/Facility Name: No data recorded Practice/Facility Phone Number: No data recorded Name of Contact: No data recorded Contact Number: No data  recorded Contact Fax Number: No data recorded Prescriber Name: No data recorded Prescriber Address (if known): No data recorded  What Is the Reason for Your Visit/Call Today? Pt daughter reports pt having auditory hallucinations x2 weeks now and was sent by PCP to be evaluated for dementia. Pt is deaf but reports that people are wanting to kill him and his family. Pt had a recent increase in his Seroquel prescription.  How Long Has This Been Causing You Problems? > than 6 months  What Do You Feel Would Help You the Most Today? No data recorded  Have You Recently Been in Any Inpatient Treatment (Hospital/Detox/Crisis Center/28-Day Program)? No data recorded Name/Location of Program/Hospital:No data recorded How Long Were You There? No data recorded When Were You Discharged? No data recorded  Have You Ever Received Services From [facility] Before? No data recorded Who Do You See at [facility]? No data recorded  Have You Recently Had Any Thoughts About Hurting Yourself? No  Are You Planning to Commit Suicide/Harm Yourself At This time? No   Have you Recently Had Thoughts About Hurting Someone Karolee Ohs? No  Explanation: No data recorded  Have You Used Any Alcohol or Drugs in the Past 24 Hours? No  How Long Ago Did You Use Drugs or Alcohol? No data recorded What Did You Use and How Much? No data recorded  Do You Currently Have a Therapist/Psychiatrist? No  Name of Therapist/Psychiatrist: No data recorded  Have You Been Recently Discharged From Any Office Practice or Programs? No  Explanation of Discharge From Practice/Program: No data recorded    CCA Screening Triage Referral Assessment  Type of Contact: Face-to-Face  Is this Initial or Reassessment? No data recorded Date Telepsych consult ordered in [facility]:  No data recorded Time Telepsych consult ordered in [facility]:  No data recorded  Patient Reported Information Reviewed? No data recorded Patient Left Without Being Seen? No data  recorded Reason for Not Completing Assessment: No data recorded  Collateral Involvement: No data recorded  Does Patient Have a Court Appointed Legal Guardian? No data recorded Name and Contact of Legal Guardian: No data recorded If Minor and Not Living with Parent(s), Who has Custody? No data recorded Is CPS involved or ever been involved? Never  Is APS involved or ever been involved? Never   Patient Determined To Be At Risk for Harm To Self or Others Based on Review of Patient Reported Information or Presenting Complaint? No data recorded Method: No data recorded Availability of Means: No data recorded Intent: No data recorded Notification Required: No data recorded Additional Information for Danger to Others Potential: No data recorded Additional Comments for Danger to Others Potential: No data recorded Are There Guns or Other Weapons in Your Home? No  Types of Guns/Weapons: No data recorded Are These Weapons Safely Secured?                            No data recorded Who Could Verify You Are Able To Have These Secured: No data recorded Do You Have any Outstanding Charges, Pending Court Dates, Parole/Probation? No data recorded Contacted To Inform of Risk of Harm To Self or Others: No data recorded  Location of Assessment: [facility] ED   Does Patient Present under Involuntary Commitment? No  IVC Papers Initial File Date: No data recorded  [place] of Residence: [place]   Patient Currently Receiving the Following Services: No data recorded  Determination of Need: Emergent (2 hours)   Options For Referral: No data recorded    CCA Biopsychosocial Intake/Chief Complaint:  No data recorded Current Symptoms/Problems: No data recorded  Patient Reported Schizophrenia/Schizoaffective Diagnosis in Past: No   Strengths: Patient is able to communicate his needs; has the support of his family  Preferences: No data recorded Abilities: No data recorded  Type of Services  Patient Feels are Needed: No data recorded  Initial Clinical Notes/Concerns: No data recorded  Mental Health Symptoms Depression:  None   Duration of Depressive symptoms: No data recorded  Mania:  None   Anxiety:   None   Psychosis:  Hallucinations   Duration of Psychotic symptoms: Greater than six months   Trauma:  None   Obsessions:  None   Compulsions:  None   Inattention:  None   Hyperactivity/Impulsivity:  None   Oppositional/Defiant Behaviors:  None   Emotional Irregularity:  None   Other Mood/Personality Symptoms:  No data recorded   Mental Status Exam Appearance and self-care  Stature:  Average   Weight:  Average weight   Clothing:  Casual   Grooming:  Normal   Cosmetic use:  None   Posture/gait:  Normal   Motor activity:  Not Remarkable   Sensorium  Attention:  Normal   Concentration:  Normal   Orientation:  X5   Recall/memory:  Normal   Affect and Mood  Affect:  Appropriate   Mood:  Other (Comment)   Relating  Eye contact:  Normal   Facial expression:  Responsive   Attitude toward examiner:  Cooperative   Thought and Language  Speech flow: Clear and Coherent  Thought content:  Appropriate to Mood and Circumstances   Preoccupation:  None   Hallucinations:  Auditory   Organization:  No data recorded  [employer] of Knowledge:  Fair; Good   Intelligence:  Average   Abstraction:  Normal   Judgement:  Good   Reality Testing:  Adequate   Insight:  Good   Decision Making:  Normal   Social Functioning  Social Maturity:  Responsible   Social Judgement:  Normal   Stress  Stressors:  Illness   Coping Ability:  Contractor Deficits:  None   Supports:  Family; Friends/Service system     Religion: Religion/Spirituality Are You A Religious Person?: No  Leisure/Recreation: Leisure / Recreation Do You Have Hobbies?: No  Exercise/Diet: Exercise/Diet Do You Exercise?: No Have You  Gained or Lost A Significant Amount of Weight in the Past Six Months?: No Do You Follow a Special Diet?: No Do You Have Any Trouble Sleeping?: No   CCA Employment/Education Employment/Work Situation: Employment / Work 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 has Been Impacted by Current Illness: No Has Patient ever Been in Equities trader?: No  Education: Education Is Patient Currently Attending School?: No Did You Have An Individualized Education Program (IIEP): No Did You Have Any Difficulty At [employer]?: No Patient's Education Has Been Impacted by Current Illness: No   CCA Family/Childhood History Family and Relationship History: Family history Marital status: Married Number of Years Married: 61 What types of issues is patient dealing with in the relationship?: None reported Additional relationship information: None Does patient have children?: Yes How many children?: 1 How is patient's relationship with their children?: Patient has a good relationship with his daughter  Childhood History:  Childhood History Did patient suffer any verbal/emotional/physical/sexual abuse as a child?: No Did patient suffer from severe childhood neglect?: No Has patient ever been sexually abused/assaulted/raped as an adolescent or adult?: No Was the patient ever a victim of a crime or a disaster?: No Witnessed domestic violence?: No Has patient been affected by domestic violence as an adult?: No  Child/Adolescent Assessment:     CCA Substance Use Alcohol/Drug Use: Alcohol / Drug Use Pain Medications: see mar Prescriptions: see mar Over the Counter: see mar History of alcohol / drug use?: No history of alcohol / drug abuse                         ASAM's:  Six Dimensions of Multidimensional Assessment  Dimension 1:  Acute Intoxication and/or Withdrawal Potential:      Dimension 2:  Biomedical Conditions and Complications:      Dimension 3:  Emotional,  Behavioral, or Cognitive Conditions and Complications:     Dimension 4:  Readiness to Change:     Dimension 5:  Relapse, Continued use, or Continued Problem Potential:     Dimension 6:  Recovery/Living Environment:     ASAM Severity Score:    ASAM Recommended Level of Treatment:     Substance use Disorder (SUD)    Recommendations for Services/Supports/Treatments:    DSM5 Diagnoses: There are no active problems to display for this patient.   Patient Centered Plan: Patient is on the following Treatment Plan(s):  Anxiety   Referrals to Alternative Service(s): Referred to Alternative Service(s):   Place:   Date:   Time:    Referred to Alternative Service(s):   Place:   Date:   Time:    Referred to Alternative Service(s):  Place:   Date:   Time:    Referred to Alternative Service(s):   Place:   Date:   Time:      @[email or web address] @  [employer], LCAS-A

## 2023-11-04 NOTE — Consult Note (Signed)
[facility] Consult Note  Patient Name: Brandon Barrett MRN: [id number] DOB: June 05, 1936 DATE OF Consult: 11/04/2023  PRIMARY PSYCHIATRIC DIAGNOSES  1.  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 If patient is not voluntary, then discharge is recommended.   Medication recommendations:  will increase quetiapine 12.5mg  po in AM; continue with quetiapine 25mg  po at bedtime for psychosis; prescription sent to [facility] electronically; continue with sertraline 50mg  po daily; patient is also on lamotrigine and propranolol medically (if possible on insurance formulary; recommend consider NUPLAZID: The only FDA-approved medicine for treating hallucinations and delusions associated with Parkinson's disease if confirmed for Parkinson's Disease; if confirmed for Lewy Body Dementia, recommend initiation of aricept or namenda as may also help with the hallucinations)  Non-Medication recommendations:  informed wife to call in AM to [place] to obtain a psychiatrist appt and not to wait until PCP appt in 2 months; enc to keep MRI appt for this Sat and need MRI to assist with diagnostic clarification.  Discussed wandering safety at night; recommended door alarms; discussed fall risk and prevention   I have discussed my assessment and treatment recommendations with the patient. Possible medication side effects/risks/benefits of current regimen.   Importance of medication adherence for medication to be beneficial. Discussed risk of increased falls, drooling, EPS, shuffle gate, FDA warnings with quetiapine.    Follow-Up Telepsychiatry C/L services:            []  We will continue to follow this patient with you.             [x]  Will sign off for now. Please  re-consult our service as necessary.  Thank you for involving [place] in the care of this patient. If you have any additional questions or concerns, please call [phone] and ask for me or the provider on-call.  TELEPSYCHIATRY ATTESTATION & CONSENT  As the provider for this telehealth consult, I attest that I verified the patient's identity using two separate identifiers, introduced myself to the patient, provided my credentials, disclosed my location, and performed this encounter via a HIPAA-compliant, real-time, face-to-face, two-way, interactive audio and video platform and with the full consent and agreement of the patient (or guardian as applicable.)  Patient physical location: [facility]. Telehealth provider physical location: home office in state of [place]  Video start time: 00:52 am (Central Time) Video end time: 01:45am  (Central Time)  IDENTIFYING DATA  Brandon Barrett is a 88 y.o. year-old male for whom a psychiatric consultation has been ordered by the primary provider. The patient was identified using two separate identifiers.  CHIEF COMPLAINT/REASON FOR CONSULT  "Because I am hearing voices, started out was music, singing, then it went to talking"  HISTORY OF PRESENT ILLNESS (HPI)  The 88 yo male patient presents to emergency department for auditory hallucinations.  Wife reports the neurologist recommend they come to the emergency department Wife is present at bedside   Hx of treatment for cognitive decline and wife reports ruling out Parkinson's; review of medication record indicates potential concerns for Lewy Body Dementia but wife reports no family concerns for patient appearing confused,  forgetful or disoriented.   Patient was alert and oriented during interview, did not appear confused.                                          Currently prescribed:lamotrigine for seizures propranolol for tremors; sertraline (initiated 5 months ago) quetiapine (initiated 2-3 months  ago)  Wife and patient reports auditory hallucinations began couple years ago with hearing music and this did not really bother the patient;  However, reports in the past year, he began hearing voices.  Reports in the past month, the auditory hallucinations worse in a derogatory nature which has begun to upset him.  Patient stated "they say bad things about my grandson and my daughter's dog".  He reports hearing voices  "calling my grandson is queer, they say they are going to hurt my daughter's dog".  He denied voices being commanding of  him.  Wife states he gets up at night, goes outside with flashlight and yell at the voices.    Patient denied symptoms of depression denied anergia, anhedonia, amotivation, has increased anxiety and worry due to the hallucinations; no reported panic symptoms, no reported obsessive/compulsive behaviors. Client denies active SI/HI ideations, plans or intent.  Client denied past episodes of hypomania, hyperactivity, erratic/excessive spending, involvement in dangerous activities, self-inflated ego, grandiosity, or promiscuity.  No past psychiatric treatment;  sleeping 10+ hrs/24hrs, appetite good concentration fair. Reviewed active outpatient medication list/reviewed labs. Obtained Collateral information from medical record.  Wife expressed concerns over other symptoms such as worsening tremors, shuffled gait, and drooling.  He [facility] and has new PCP appt "in Feb or March".   PAST PSYCHIATRIC HISTORY  Previous Psychiatric Hospitalizations: denied Previous Detox/Residential treatments:denied Outpt treatment:  neurology  Previous psychotropic medication trials: denied Previous mental health diagnosis per client/MEDICAL RECORD NUMBERdenied  Suicide attempts/self-injurious behaviors:  denied history of suicidal/homicidal ideation/gestures; denied history of self-harm behaviors  History of trauma/abuse/neglect/exploitation:  denied   PAST MEDICAL HISTORY  Past Medical History:  Diagnosis Date   Basal cell carcinoma 12/13/2013   left inferior forehead    Basal cell carcinoma 07/03/2014   left temple zygoma    Basal cell carcinoma 12/13/2013   left forehead    Basal cell carcinoma 01/21/2015   R mid back paraspinal    Basal cell carcinoma 05/11/2023   L temple - ED&C   BCC (basal cell carcinoma) 09/28/2022   left forehead 2 cm above brow, tx'd with EDC   Skin cancer 12/23/2022   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   Multiple Vitamins-Minerals (CENTRUM SILVER PO) Take by mouth.   fish oil-omega-3 fatty acids 1000 MG capsule Take by mouth.   aspirin 325 MG tablet Take by mouth.   atorvastatin (LIPITOR) 40 MG tablet Take 1  tablet by mouth daily.   azelastine (ASTELIN) 0.1 % nasal spray Place into the nose.   B Complex Vitamins (VITAMIN B COMPLEX) TABS Take 1 tablet by mouth daily.   Cholecalciferol 25 MCG (1000 UT) tablet Take by mouth.   cyanocobalamin 1000 MCG tablet Take by mouth.   fluticasone (FLONASE) 50 MCG/ACT nasal spray Place into the nose.   lamoTRIgine (LAMICTAL) 100 MG tablet Take 100 mg by mouth 2 (two) times daily.   levothyroxine (SYNTHROID) 25 MCG tablet TAKE 1 TABLET ONCE DAILY TAKE ON AN EMPTY STOMACH WITH A GLASS OF WATER AT LEAST 30 TO 60 MINUTES BEFORE BREAKFAST   metFORMIN (GLUCOPHAGE) 500 MG tablet Take 1 tablet by mouth 2 (two) times daily with a meal.   montelukast (SINGULAIR) 10 MG tablet Take by mouth.   olmesartan (BENICAR)  20 MG tablet Take 20 mg by mouth daily.   oxybutynin (DITROPAN-XL) 10 MG 24 hr tablet Take 1 tablet by mouth daily.   propranolol ER (INDERAL LA) 60 MG 24 hr capsule Take 60 mg by mouth daily.   tamsulosin (FLOMAX) 0.4 MG CAPS capsule TAKE 1 CAPSULE DAILY 30 MINUTES AFTER THE SAME MEAL   mometasone (ELOCON) 0.1 % cream Apply to right ankle rash QD up to 5 days per week.     ALLERGIES  No Known Allergies  SOCIAL & SUBSTANCE USE HISTORY   Living Situation: wife, grandson and daughter                 Retired--[employer]  Denied current legal issues. 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  denied combat   Social Drivers of Health Y/N   Physicist, medical Strain: N  Food Insecurity: N  Transportation Needs: N  Physical Activity: N  Stress: N  Social Connections: N  Intimate Partner Violence: N  Housing Stability: N      Denied alcohol or illicit drug use         FAMILY HISTORY   Family Psychiatric History (if known):  unknown at this time  MENTAL STATUS EXAM (MSE)  Mental Status Exam: General Appearance: Casual  Orientation:  Full (Time, Place, and Person)  Memory:  Immediate;   Good Recent;   Fair Remote;   Fair  Concentration:  Concentration: Good  Recall:  Good  Attention  Good  Eye Contact:  Good  Speech:  Clear and Coherent  Language:  Good  Volume:  Increased he is hard of hearing  Mood: "good"  Affect:  Appropriate  Thought Process:  Goal Directed  Thought Content:  Hallucinations: Auditory and Rumination  Suicidal Thoughts:  No  Homicidal Thoughts:  No  Judgement:  Fair  Insight:  Good  Psychomotor Activity:  Tremor  Akathisia:  No  Fund of Knowledge:  Good    Assets:  Communication Skills Desire for Improvement Financial Resources/Insurance Housing Social Support  Cognition:  Impaired,  Mild  ADL's:  Intact  AIMS (if indicated):       VITALS  Blood pressure 131/79, pulse 62, temperature 98.5 F (36.9 C), temperature source Oral,  resp. rate 19, height 5\' 7"  (1.702 m), weight 74.8 kg, SpO2 94%.  LABS  Admission on 11/03/2023  Component Date Value Ref Range Status   Sodium 11/03/2023 134 (L)  135 - 145 mmol/L Final   Potassium 11/03/2023 4.3  3.5 - 5.1 mmol/L Final   Chloride 11/03/2023 106  98 - 111 mmol/L Final   CO2 11/03/2023 19 (L)  22 - 32 mmol/L Final  Glucose, Bld 11/03/2023 236 (H)  70 - 99 mg/dL Final   Glucose reference range applies only to samples taken after fasting for at least 8 hours.   BUN 11/03/2023 20  8 - 23 mg/dL Final   Creatinine, Ser 11/03/2023 1.26 (H)  0.61 - 1.24 mg/dL Final   Calcium 40/07/2724 9.2  8.9 - 10.3 mg/dL Final   Total Protein 36/64/4034 7.1  6.5 - 8.1 g/dL Final   Albumin 74/25/9563 3.8  3.5 - 5.0 g/dL Final   AST 87/56/4332 21  15 - 41 U/L Final   ALT 11/03/2023 18  0 - 44 U/L Final   Alkaline Phosphatase 11/03/2023 92  38 - 126 U/L Final   Total Bilirubin 11/03/2023 0.8  0.0 - 1.2 mg/dL Final   GFR, Estimated 11/03/2023 56 (L)  >60 mL/min Final   Comment: (NOTE) Calculated using the CKD-EPI Creatinine Equation (2021)    Anion gap 11/03/2023 9  5 - 15 Final   Performed at [facility], [address] [place], [place] [address]   Alcohol, Ethyl (B) 11/03/2023 <10  <10 mg/dL Final   Comment: (NOTE) Lowest detectable limit for serum alcohol is 10 mg/dL.  For medical purposes only. Performed at [facility], [address] [place], [place] [address]    Tricyclic, Ur Screen 11/03/2023 NONE DETECTED  NONE DETECTED Final   Amphetamines, Ur Screen 11/03/2023 NONE DETECTED  NONE DETECTED Final   MDMA (Ecstasy)Ur Screen 11/03/2023 NONE DETECTED  NONE DETECTED Final   Cocaine Metabolite,Ur [place] 11/03/2023 NONE DETECTED  NONE DETECTED Final   Opiate, Ur Screen 11/03/2023 NONE DETECTED  NONE DETECTED Final   Phencyclidine (PCP) Ur S 11/03/2023 NONE DETECTED  NONE DETECTED Final   Cannabinoid 50 Ng, Ur [place] 11/03/2023 NONE DETECTED  NONE DETECTED Final    Barbiturates, Ur Screen 11/03/2023 NONE DETECTED  NONE DETECTED Final   Benzodiazepine, Ur Scrn 11/03/2023 NONE DETECTED  NONE DETECTED Final   Methadone Scn, Ur 11/03/2023 NONE DETECTED  NONE DETECTED Final   Comment: (NOTE) Tricyclics + metabolites, urine    Cutoff 1000 ng/mL Amphetamines + metabolites, urine  Cutoff 1000 ng/mL MDMA (Ecstasy), urine              Cutoff 500 ng/mL Cocaine Metabolite, urine          Cutoff 300 ng/mL Opiate + metabolites, urine        Cutoff 300 ng/mL Phencyclidine (PCP), urine         Cutoff 25 ng/mL Cannabinoid, urine                 Cutoff 50 ng/mL Barbiturates + metabolites, urine  Cutoff 200 ng/mL Benzodiazepine, urine              Cutoff 200 ng/mL Methadone, urine                   Cutoff 300 ng/mL  The urine drug screen provides only a preliminary, unconfirmed analytical test result and should not be used for non-medical purposes. Clinical consideration and professional judgment should be applied to any positive drug screen result due to possible interfering substances. A more specific alternate chemical method must be used in order to obtain a confirmed analytical result. Gas chromatography / mass spectrometry (GC/MS) is the preferred confirm                          atory method. Performed at [facility], [phone] [place], [place] [address]    WBC 11/03/2023 8.5  4.0 - 10.5 K/uL Final   RBC 11/03/2023 3.75 (L)  4.22 - 5.81 MIL/uL Final   Hemoglobin 11/03/2023 12.0 (L)  13.0 - 17.0 g/dL Final   HCT 60/45/4098 35.9 (L)  39.0 - 52.0 % Final   MCV 11/03/2023 95.7  80.0 - 100.0 fL Final   MCH 11/03/2023 32.0  26.0 - 34.0 pg Final   MCHC 11/03/2023 33.4  30.0 - 36.0 g/dL Final   RDW 11/91/4782 12.3  11.5 - 15.5 % Final   Platelets 11/03/2023 176  150 - 400 K/uL Final   nRBC 11/03/2023 0.0  0.0 - 0.2 % Final   Neutrophils Relative % 11/03/2023 52  % Final   Neutro Abs 11/03/2023 4.5  1.7 - 7.7 K/uL Final   Lymphocytes Relative  11/03/2023 26  % Final   Lymphs Abs 11/03/2023 2.2  0.7 - 4.0 K/uL Final   Monocytes Relative 11/03/2023 16  % Final   Monocytes Absolute 11/03/2023 1.4 (H)  0.1 - 1.0 K/uL Final   Eosinophils Relative 11/03/2023 4  % Final   Eosinophils Absolute 11/03/2023 0.4  0.0 - 0.5 K/uL Final   Basophils Relative 11/03/2023 1  % Final   Basophils Absolute 11/03/2023 0.0  0.0 - 0.1 K/uL Final   Immature Granulocytes 11/03/2023 1  % Final   Abs Immature Granulocytes 11/03/2023 0.04  0.00 - 0.07 K/uL Final   Performed at [facility], [address] [place], [place] [address]   Color, Urine 11/03/2023 YELLOW (A)  YELLOW Final   APPearance 11/03/2023 CLEAR (A)  CLEAR Final   Specific Gravity, Urine 11/03/2023 1.013  1.005 - 1.030 Final   pH 11/03/2023 5.0  5.0 - 8.0 Final   Glucose, UA 11/03/2023 50 (A)  NEGATIVE mg/dL Final   Hgb urine dipstick 11/03/2023 NEGATIVE  NEGATIVE Final   Bilirubin Urine 11/03/2023 NEGATIVE  NEGATIVE Final   Ketones, ur 11/03/2023 NEGATIVE  NEGATIVE mg/dL Final   Protein, ur 30/86/5784 NEGATIVE  NEGATIVE mg/dL Final   Nitrite 69/62/9528 NEGATIVE  NEGATIVE Final   Leukocytes,Ua 11/03/2023 NEGATIVE  NEGATIVE Final   Performed at [facility], [address] [place], [place] [address]   Troponin I (High Sensitivity) 11/03/2023 4  <18 ng/L Final   Comment: (NOTE) Elevated high sensitivity troponin I (hsTnI) values and significant  changes across serial measurements may suggest ACS but many other  chronic and acute conditions are known to elevate hsTnI results.  Refer to the "Links" section for chest pain algorithms and additional  guidance. Performed at [facility], [address] [place], [place] [address]     PSYCHIATRIC REVIEW OF SYSTEMS (ROS)  Depression:      [x]  Denies all symptoms of depression [] Depressed mood       [] Insomnia/hypersomnia              [] Fatigue        [] Change in appetite     [] Anhedonia                                 [] Difficulty concentrating      [] Hopelessness             [] Worthlessness [] Guilt/shame                [] Psychomotor agitation/retardation   Mania:     [x] Denies all symptoms of  mania [] Elevated mood           [] Irritability         [] Pressured speech         []  Grandiosity         []  Decreased need for sleep                                                 [] Increased energy          []  Increase in goal directed activity                                       [] Flight of ideas    []  Excessive involvement in high-risk behaviors                   []  Distractibility     Psychosis:     [] Denies all symptoms of psychosis [] Paranoia         [x]  Auditory Hallucinations          [] Visual hallucinations         [] ELOC        [] IOR                [] Delusions   Suicide:    [x]  Denies SI/plan/intent []  Passive SI         []   Active SI         [] Plan           [] Intent   Homicide:  [x]   Denies HI/plan/intent []  Passive HI         []  Active HI         [] Plan            [] Intent           [] Identified Target    Additional findings:      Musculoskeletal: Impaired      Gait & Station: Laying/Sitting      Pain Screening: Present - mild to moderate        RISK FORMULATION/ASSESSMENT  Is the patient experiencing any suicidal or homicidal ideations: No       Explain if yes:  Protective factors considered for safety management:     Access to adequate health care Advice& help seeking Children Sense of responsibility Life Satisfaction Positive coping skills Positive social support: Positive therapeutic relationship Future oriented Suicide Inquiry:  Denies suicidal ideations, intentions, or plans.  Denies  recent self-harm behavior. Talks futuristically.  Risk factors/concerns considered for safety management:  Physical illness/chronic pain Access to lethal means Age over 47 Male gender  Is there a safety management plan with the patient and treatment team to minimize risk factors and  promote protective factors: Yes           Explain: safety monitoring, fall risk  Is crisis care placement or psychiatric hospitalization recommended: No     Based on my current evaluation and risk assessment, patient is determined at this time to be at:  Low risk for suicide or violent behavior; he is a fall and wander risk   *RISK ASSESSMENT Risk assessment is a dynamic process; it is possible that this patient's condition, and risk level, may change. This should be re-evaluated and managed over time  as appropriate. Please re-consult psychiatric consult services if additional assistance is needed in terms of risk assessment and management. If your team decides to discharge this patient, please advise the patient how to best access emergency psychiatric services, or to call 911, if their condition worsens or they feel unsafe in any way.  Total time spent in this encounter was 70 minutes with greater than 50% of time spent in counseling and coordination of care.     Dr. Olivia Mackie. Christell Constant, PhD, MSN, APRN, PMHNP-BC, [facility], NP Telepsychiatry Consult Services

## 2023-11-04 NOTE — ED Notes (Signed)
Telepsych consult in progress

## 2023-11-06 ENCOUNTER — Ambulatory Visit
Admission: RE | Admit: 2023-11-06 | Discharge: 2023-11-06 | Disposition: A | Discharge status: Home / Self Care | Payer: Medicare Other | Source: Ambulatory Visit | Attending: Student | Admitting: Student

## 2023-11-06 DIAGNOSIS — R569 Unspecified convulsions: Secondary | ICD-10-CM

## 2023-11-06 DIAGNOSIS — R2689 Other abnormalities of gait and mobility: Secondary | ICD-10-CM

## 2023-11-06 DIAGNOSIS — R4189 Other symptoms and signs involving cognitive functions and awareness: Secondary | ICD-10-CM

## 2023-11-10 ENCOUNTER — Ambulatory Visit: Payer: Medicare Other | Admitting: Dermatology

## 2023-11-15 ENCOUNTER — Ambulatory Visit: Payer: Medicare Other | Admitting: Dermatology

## 2023-11-15 ENCOUNTER — Encounter: Payer: Self-pay | Admitting: Dermatology

## 2023-11-15 DIAGNOSIS — L578 Other skin changes due to chronic exposure to nonionizing radiation: Secondary | ICD-10-CM

## 2023-11-15 DIAGNOSIS — Z1283 Encounter for screening for malignant neoplasm of skin: Secondary | ICD-10-CM | POA: Diagnosis not present

## 2023-11-15 DIAGNOSIS — L570 Actinic keratosis: Secondary | ICD-10-CM | POA: Diagnosis not present

## 2023-11-15 DIAGNOSIS — L821 Other seborrheic keratosis: Secondary | ICD-10-CM

## 2023-11-15 DIAGNOSIS — L300 Nummular dermatitis: Secondary | ICD-10-CM | POA: Diagnosis not present

## 2023-11-15 DIAGNOSIS — W908XXA Exposure to other nonionizing radiation, initial encounter: Secondary | ICD-10-CM

## 2023-11-15 DIAGNOSIS — L820 Inflamed seborrheic keratosis: Secondary | ICD-10-CM | POA: Diagnosis not present

## 2023-11-15 DIAGNOSIS — L814 Other melanin hyperpigmentation: Secondary | ICD-10-CM

## 2023-11-15 DIAGNOSIS — D229 Melanocytic nevi, unspecified: Secondary | ICD-10-CM

## 2023-11-15 DIAGNOSIS — D1801 Hemangioma of skin and subcutaneous tissue: Secondary | ICD-10-CM

## 2023-11-15 DIAGNOSIS — Z85828 Personal history of other malignant neoplasm of skin: Secondary | ICD-10-CM

## 2023-11-15 MED ORDER — TRIAMCINOLONE ACETONIDE 0.1 % EX CREA: 1.0000 | TOPICAL_CREAM | Freq: Two times a day (BID) | CUTANEOUS | 11 refills | Status: AC | PRN

## 2023-11-15 NOTE — Progress Notes (Signed)
Follow-Up Visit   Subjective  Brandon Barrett is a 88 y.o. male who presents for the following: Skin Cancer Screening and Full Body Skin Exam. TBSE, AK f/u, recheck SCC scalp. Hx of BCCs, SCCs, Aks. Pt does have spot on L ear that wants looked at, white spots on L temple.   Pt accompanied by wife who contributes to hx  The patient presents for Total-Body Skin Exam (TBSE) for skin cancer screening and mole check. The patient has spots, moles and lesions to be evaluated, some may be new or changing and the patient may have concern these could be cancer.   The following portions of the chart were reviewed this encounter and updated as appropriate: medications, allergies, medical history  Review of Systems:  No other skin or systemic complaints except as noted in HPI or Assessment and Plan.  Objective  Well appearing patient in no apparent distress; mood and affect are within normal limits.  A full examination was performed including scalp, head, eyes, ears, nose, lips, neck, chest, axillae, abdomen, back, buttocks, bilateral upper extremities, bilateral lower extremities, hands, feet, fingers, toes, fingernails, and toenails. All findings within normal limits unless otherwise noted below.   Relevant physical exam findings are noted in the Assessment and Plan.  Left Ear Stuck on waxy paps with erythema Right Lower Leg - Anterior, Back Circular scaly patches on right lower leg, central lower back, gluteal cleft Scalp Pink scaly macules  Assessment & Plan   SKIN CANCER SCREENING PERFORMED TODAY.  ACTINIC DAMAGE WITH PRECANCEROUS ACTINIC KERATOSES Counseling for Topical Chemotherapy Management: Patient exhibits: - Severe, confluent actinic changes with pre-cancerous actinic keratoses that is secondary to cumulative UV radiation exposure over time - Condition that is severe; chronic, not at goal. - diffuse scaly erythematous macules and papules with underlying dyspigmentation on  head and neck - Recommend daily broad spectrum sunscreen SPF 30+ to sun-exposed areas, reapply every 2 hours as needed.  - Staying in the shade or wearing long sleeves, sun glasses (UVA+UVB protection) and wide brim hats (4-inch brim around the entire circumference of the hat) are also recommended. - offered cryotherapy. Jointly decided to monitor for progression of individual lesions. Call for new or changing lesions.  LENTIGINES, SEBORRHEIC KERATOSES, HEMANGIOMAS - Benign normal skin lesions - Benign-appearing - Call for any changes  MELANOCYTIC NEVI - Tan-brown and/or pink-flesh-colored symmetric macules and papules - Benign appearing on exam today - Observation - Call clinic for new or changing moles - Recommend daily use of broad spectrum spf 30+ sunscreen to sun-exposed areas.   HISTORY OF BASAL CELL CARCINOMA OF THE SKIN - No evidence of recurrence today - Recommend regular full body skin exams - Recommend daily broad spectrum sunscreen SPF 30+ to sun-exposed areas, reapply every 2 hours as needed.  - Call if any new or changing lesions are noted between office visits   HISTORY OF SQUAMOUS CELL CARCINOMA OF THE SKIN - No evidence of recurrence today - No lymphadenopathy - Recommend regular full body skin exams - Recommend daily broad spectrum sunscreen SPF 30+ to sun-exposed areas, reapply every 2 hours as needed.  - Call if any new or changing lesions are noted between office visits  HISTORY of ATYPICAL FIBROXANTHOMA R scalp, mohs with Dr. Adriana Simas 04/01/2023 Clear. Observe for recurrence.  Call clinic for new or changing lesions.  Recommend regular skin exams, daily broad-spectrum spf 30+ sunscreen use, and photoprotection.    INFLAMED SEBORRHEIC KERATOSIS Left Ear Symptomatic, irritating, patient would like  treated. Destruction of lesion - Left Ear Complexity: simple   Destruction method: cryotherapy   Informed consent: discussed and consent obtained   Timeout:   patient name, date of birth, surgical site, and procedure verified Lesion destroyed using liquid nitrogen: Yes   Region frozen until ice ball extended beyond lesion: Yes   Cryo cycles: 1 or 2. Outcome: patient tolerated procedure well with no complications   Post-procedure details: wound care instructions given   NUMMULAR DERMATITIS Right Lower Leg - Anterior, Back Apply TMC 0.1% cream BID to aa.   Topical steroids (such as triamcinolone, fluocinolone, fluocinonide, mometasone, clobetasol, halobetasol, betamethasone, hydrocortisone) can cause thinning and lightening of the skin if they are used for too long in the same area. Your physician has selected the right strength medicine for your problem and area affected on the body. Please use your medication only as directed by your physician to prevent side effects.    Avoid applying to face, groin, and axilla. Use as directed. Long-term use can cause thinning of the skin.   AK (ACTINIC KERATOSIS) Scalp Actinic keratoses are precancerous spots that appear secondary to cumulative UV radiation exposure/sun exposure over time. They are chronic with expected duration over 1 year. A portion of actinic keratoses will progress to squamous cell carcinoma of the skin. It is not possible to reliably predict which spots will progress to skin cancer and so treatment is recommended to prevent development of skin cancer.  Recommend daily broad spectrum sunscreen SPF 30+ to sun-exposed areas, reapply every 2 hours as needed.  Recommend staying in the shade or wearing long sleeves, sun glasses (UVA+UVB protection) and wide brim hats (4-inch brim around the entire circumference of the hat). Call for new or changing lesions.  Treatment deferred, will monitor and recheck at follow up. Destruction of lesion - Scalp MULTIPLE BENIGN NEVI   LENTIGINES   ACTINIC ELASTOSIS   SEBORRHEIC KERATOSES   CHERRY ANGIOMA   Return in about 4 months (around  03/14/2024).  Wynonia Lawman, CMA, am acting as scribe for Elie Goody, MD .   Documentation: I have reviewed the above documentation for accuracy and completeness, and I agree with the above.  Elie Goody, MD

## 2023-11-15 NOTE — Patient Instructions (Signed)
 Cryotherapy Aftercare  Wash gently with soap and water everyday.   Apply Vaseline and Band-Aid daily until healed.    Due to recent changes in healthcare laws, you may see results of your pathology and/or laboratory studies on MyChart before the doctors have had a chance to review them. We understand that in some cases there may be results that are confusing or concerning to you. Please understand that not all results are received at the same time and often the doctors may need to interpret multiple results in order to provide you with the best plan of care or course of treatment. Therefore, we ask that you please give [place] 2 business days to thoroughly review all your results before contacting the office for clarification. Should we see a critical lab result, you will be contacted sooner.   If You Need Anything After Your Visit  If you have any questions or concerns for your doctor, please call our main line at [phone] and press option 4 to reach your doctor's medical assistant. If no one answers, please leave a voicemail as directed and we will return your call as soon as possible. Messages left after 4 pm will be answered the following business day.   You may also send [place] a message via MyChart. We typically respond to MyChart messages within 1-2 business days.  For prescription refills, please ask your pharmacy to contact our office. Our fax number is [phone].  If you have an urgent issue when the clinic is closed that cannot wait until the next business day, you can page your doctor at the number below.    Please note that while we do our best to be available for urgent issues outside of office hours, we are not available 24/7.   If you have an urgent issue and are unable to reach [place], you may choose to seek medical care at your doctor's office, retail clinic, urgent care center, or emergency room.  If you have a medical emergency, please immediately call 911 or go to the emergency  department.  Pager Numbers  - Dr. Gwen Pounds: [phone]  - Dr. Roseanne Reno: [phone]  - Dr. Katrinka Blazing: [phone]   In the event of inclement weather, please call our main line at [phone] for an update on the status of any delays or closures.  Dermatology Medication Tips: Please keep the boxes that topical medications come in in order to help keep track of the instructions about where and how to use these. Pharmacies typically print the medication instructions only on the boxes and not directly on the medication tubes.   If your medication is too expensive, please contact our office at [phone] option 4 or send [place] a message through MyChart.   We are unable to tell what your co-pay for medications will be in advance as this is different depending on your insurance coverage. However, we may be able to find a substitute medication at lower cost or fill out paperwork to get insurance to cover a needed medication.   If a prior authorization is required to get your medication covered by your insurance company, please allow [place] 1-2 business days to complete this process.  Drug prices often vary depending on where the prescription is filled and some pharmacies may offer cheaper prices.  The website www.goodrx.com contains coupons for medications through different pharmacies. The prices here do not account for what the cost may be with help from insurance (it may be cheaper with your insurance), but the website can  give you the price if you did not use any insurance.  - You can print the associated coupon and take it with your prescription to the pharmacy.  - You may also stop by our office during regular business hours and pick up a GoodRx coupon card.  - If you need your prescription sent electronically to a different pharmacy, notify our office through [facility] or by phone at [phone] option 4.     Si Usted Necesita Algo Despus de Su Visita  Tambin puede enviarnos  un mensaje a travs de Clinical cytogeneticist. Por lo general respondemos a los mensajes de MyChart en el transcurso de 1 a 2 das hbiles.  Para renovar recetas, por favor pida a su farmacia que se ponga en contacto con nuestra oficina. Annie Sable de fax es [place] [phone].  Si tiene un asunto urgente cuando la clnica est cerrada y que no puede esperar hasta el siguiente da hbil, puede llamar/localizar a su doctor(a) al nmero que aparece a continuacin.   Por favor, tenga en cuenta que aunque hacemos todo lo posible para estar disponibles para asuntos urgentes fuera del horario de [place], no estamos disponibles las 24 horas del da, los 7 [address]  [address] de la [place].   Si tiene un problema urgente y no puede comunicarse con nosotros, puede optar por buscar atencin mdica  en el consultorio de su doctor(a), en una clnica privada, en un centro de atencin urgente o en una sala de emergencias.  Si tiene Engineer, drilling, por favor llame inmediatamente al 911 o vaya a la sala de emergencias.  Nmeros de bper  - Dr. Gwen Pounds: [phone]  - Dra. Roseanne Reno: [phone]  - Dr. Katrinka Blazing: [phone]   En caso de inclemencias del tiempo, por favor llame a Lacy Duverney principal al [phone] para una actualizacin sobre el [place] de cualquier retraso o cierre.  Consejos para la medicacin en dermatologa: Por favor, guarde las cajas en las que vienen los medicamentos de uso tpico para ayudarle a seguir las instrucciones sobre dnde y cmo usarlos. Las farmacias generalmente imprimen las instrucciones del medicamento slo en las cajas y no directamente en los tubos del [place].   Si su medicamento es muy caro, por favor, pngase en contacto con Rolm Gala llamando al [phone] y presione la opcin 4 o envenos un mensaje a travs de Clinical cytogeneticist.   No podemos decirle cul ser su copago por los medicamentos por adelantado ya que esto es diferente dependiendo de la cobertura de su seguro. Sin  embargo, es posible que podamos encontrar un medicamento sustituto a Audiological scientist un formulario para que el seguro cubra el medicamento que se considera necesario.   Si se requiere una autorizacin previa para que su compaa de seguros [place] su medicamento, por favor permtanos de 1 a 2 das hbiles para completar [address]  Los precios de los medicamentos varan con frecuencia dependiendo del Environmental consultant de dnde se surte la receta y alguna farmacias pueden ofrecer precios ms baratos.  El sitio web www.goodrx.com tiene cupones para medicamentos de Health and safety inspector. Los precios aqu no tienen en cuenta lo que podra costar con la ayuda del seguro (puede ser ms barato con su seguro), pero el sitio web puede darle el precio si no utiliz Tourist information centre manager.  - Puede imprimir el cupn correspondiente y llevarlo con su receta a la farmacia.  - Tambin puede pasar por nuestra oficina durante el horario de atencin regular y Education officer, museum una tarjeta de cupones de GoodRx.  -  Si necesita que su receta se enve electrnicamente a Psychiatrist, informe a nuestra oficina a travs de MyChart de [place] o por telfono llamando al [phone] y presione la opcin 4.

## 2023-12-23 ENCOUNTER — Emergency Department
Admission: EM | Admit: 2023-12-23 | Discharge: 2023-12-23 | Disposition: A | Discharge status: Home / Self Care | Attending: Emergency Medicine | Admitting: Emergency Medicine

## 2023-12-23 ENCOUNTER — Emergency Department

## 2023-12-23 ENCOUNTER — Other Ambulatory Visit: Payer: Self-pay

## 2023-12-23 DIAGNOSIS — S0101XA Laceration without foreign body of scalp, initial encounter: Secondary | ICD-10-CM | POA: Insufficient documentation

## 2023-12-23 DIAGNOSIS — F0390 Unspecified dementia without behavioral disturbance: Secondary | ICD-10-CM | POA: Diagnosis not present

## 2023-12-23 DIAGNOSIS — Z23 Encounter for immunization: Secondary | ICD-10-CM | POA: Insufficient documentation

## 2023-12-23 DIAGNOSIS — I10 Essential (primary) hypertension: Secondary | ICD-10-CM | POA: Diagnosis not present

## 2023-12-23 DIAGNOSIS — W19XXXA Unspecified fall, initial encounter: Secondary | ICD-10-CM | POA: Diagnosis not present

## 2023-12-23 DIAGNOSIS — S0990XA Unspecified injury of head, initial encounter: Secondary | ICD-10-CM

## 2023-12-23 HISTORY — DX: Dementia in other diseases classified elsewhere, unspecified severity, without behavioral disturbance, psychotic disturbance, mood disturbance, and anxiety: F02.80

## 2023-12-23 LAB — CBC WITH DIFFERENTIAL/PLATELET
Abs Immature Granulocytes: 0.03 10*3/uL (ref 0.00–0.07)
Basophils Absolute: 0 10*3/uL (ref 0.0–0.1)
Basophils Relative: 0 %
Eosinophils Absolute: 0.3 10*3/uL (ref 0.0–0.5)
Eosinophils Relative: 4 %
HCT: 33.2 % — ABNORMAL LOW (ref 39.0–52.0)
Hemoglobin: 11.3 g/dL — ABNORMAL LOW (ref 13.0–17.0)
Immature Granulocytes: 0 %
Lymphocytes Relative: 30 %
Lymphs Abs: 2 10*3/uL (ref 0.7–4.0)
MCH: 32.4 pg (ref 26.0–34.0)
MCHC: 34 g/dL (ref 30.0–36.0)
MCV: 95.1 fL (ref 80.0–100.0)
Monocytes Absolute: 1.1 10*3/uL — ABNORMAL HIGH (ref 0.1–1.0)
Monocytes Relative: 16 %
Neutro Abs: 3.4 10*3/uL (ref 1.7–7.7)
Neutrophils Relative %: 50 %
Platelets: 171 10*3/uL (ref 150–400)
RBC: 3.49 MIL/uL — ABNORMAL LOW (ref 4.22–5.81)
RDW: 13 % (ref 11.5–15.5)
WBC: 6.8 10*3/uL (ref 4.0–10.5)
nRBC: 0 % (ref 0.0–0.2)

## 2023-12-23 LAB — COMPREHENSIVE METABOLIC PANEL
ALT: 10 U/L (ref 0–44)
AST: 22 U/L (ref 15–41)
Albumin: 3.4 g/dL — ABNORMAL LOW (ref 3.5–5.0)
Alkaline Phosphatase: 59 U/L (ref 38–126)
Anion gap: 10 (ref 5–15)
BUN: 25 mg/dL — ABNORMAL HIGH (ref 8–23)
CO2: 16 mmol/L — ABNORMAL LOW (ref 22–32)
Calcium: 8.3 mg/dL — ABNORMAL LOW (ref 8.9–10.3)
Chloride: 111 mmol/L (ref 98–111)
Creatinine, Ser: 1.56 mg/dL — ABNORMAL HIGH (ref 0.61–1.24)
GFR, Estimated: 43 mL/min — ABNORMAL LOW (ref >60)
Glucose, Bld: 100 mg/dL — ABNORMAL HIGH (ref 70–99)
Potassium: 4.4 mmol/L (ref 3.5–5.1)
Sodium: 137 mmol/L (ref 135–145)
Total Bilirubin: 0.9 mg/dL (ref 0.0–1.2)
Total Protein: 6.3 g/dL — ABNORMAL LOW (ref 6.5–8.1)

## 2023-12-23 MED ORDER — SODIUM CHLORIDE 0.9 % IV BOLUS
500.0000 mL | Freq: Once | INTRAVENOUS | Status: AC
  Administered 2023-12-23: 500 mL via INTRAVENOUS

## 2023-12-23 MED ORDER — TETANUS-DIPHTH-ACELL PERTUSSIS 5-2.5-18.5 LF-MCG/0.5 IM SUSY
0.5000 mL | PREFILLED_SYRINGE | Freq: Once | INTRAMUSCULAR | Status: AC
  Administered 2023-12-23: 0.5 mL via INTRAMUSCULAR
  Filled 2023-12-23: qty 0.5

## 2023-12-23 MED ORDER — SODIUM CHLORIDE 0.9 % IV BOLUS
1000.0000 mL | Freq: Once | INTRAVENOUS | Status: DC
Start: 2023-12-23 — End: 2023-12-23

## 2023-12-23 MED ORDER — ACETAMINOPHEN 500 MG PO TABS
1000.0000 mg | ORAL_TABLET | Freq: Once | ORAL | Status: AC
  Administered 2023-12-23: 1000 mg via ORAL
  Filled 2023-12-23: qty 2

## 2023-12-23 NOTE — Discharge Instructions (Addendum)
 Given your low blood pressures at home and your elevated kidney function I recommended holding the losartan until you follow-up with your primary care doctor next week.  You should have your staples removed in 10 days.  Return to the ER for worsening symptoms or any other concerns

## 2023-12-23 NOTE — ED Provider Notes (Addendum)
 [facility] Provider Note    Event Date/Time   First MD Initiated Contact with Patient 12/23/23 1900     (approximate)   History   Fall   HPI  Brandon Barrett is a 88 y.o. male who comes in for a fall where he fell backwards and hit his head.  Patient has a history of dementia, hypertension.  Patient is a lack to the back of his head.  According the patient's wife he had some lower blood pressures yesterday.  She thinks it was a mechanical fall given he frequently loses his balance due to his Lewy body dementia.  He hit his head on a rock.  She did not witness the fall.  He denies any chest pain, shortness of breath or other concerns.  She did notice these low blood pressures yesterday he is on losartan, propranolol.  He otherwise is acting his normal self today.   Physical Exam   Triage Vital Signs: ED Triage Vitals [12/23/23 1735]  Encounter Vitals Group     BP 118/74     Systolic BP Percentile      Diastolic BP Percentile      Pulse Rate 79     Resp 18     Temp 98.4 F (36.9 C)     Temp Source Oral     SpO2 93 %     Weight      Height      Head Circumference      Peak Flow      Pain Score 0     Pain Loc      Pain Education      Exclude from Growth Chart     Most recent vital signs: Vitals:   12/23/23 1735  BP: 118/74  Pulse: 79  Resp: 18  Temp: 98.4 F (36.9 C)  SpO2: 93%     General: Awake, no distress.  CV:  Good peripheral perfusion.  Resp:  Normal effort.  Abd:  No distention.  Other:  Small laceration noted to the back of the head.   ED Results / Procedures / Treatments   Labs (all labs ordered are listed, but only abnormal results are displayed) Labs Reviewed  COMPREHENSIVE METABOLIC PANEL - Abnormal; Notable for the following components:      Result Value   CO2 16 (*)    Glucose, Bld 100 (*)    BUN 25 (*)    Creatinine, Ser 1.56 (*)    Calcium 8.3 (*)    Total Protein 6.3 (*)    Albumin 3.4 (*)    GFR,  Estimated 43 (*)    All other components within normal limits  CBC WITH DIFFERENTIAL/PLATELET - Abnormal; Notable for the following components:   RBC 3.49 (*)    Hemoglobin 11.3 (*)    HCT 33.2 (*)    Monocytes Absolute 1.1 (*)    All other components within normal limits     EKG  My interpretation of EKG:  Normal sinus rate of 70 without any ST elevation or obvious T wave inversions there is a little bit of artifact morbid, normal intervals  RADIOLOGY I have reviewed the CT head personally interpreted no evidence of intracranial hemorrhage   PROCEDURES:  Critical Care performed: No  .Laceration Repair  Date/Time: 12/23/2023 9:07 PM  Performed by: Concha Se, MD Authorized by: Concha Se, MD   Consent:    Consent obtained:  Verbal   Consent given by:  Patient   Risks discussed:  Infection and pain   Alternatives discussed:  No treatment Universal protocol:    Patient identity confirmed:  Verbally with patient Anesthesia:    Anesthesia method:  None Laceration details:    Location:  Scalp   Scalp location:  Mid-scalp   Length (cm):  1 Treatment:    Area cleansed with:  Saline   Irrigation method:  Syringe   Visualized foreign bodies/material removed: no   Skin repair:    Repair method:  Staples   Number of staples:  1 Approximation:    Approximation:  Close Repair type:    Repair type:  Simple Post-procedure details:    Procedure completion:  Tolerated well, no immediate complications    MEDICATIONS ORDERED IN ED: Medications  Tdap (BOOSTRIX) injection 0.5 mL (0.5 mLs Intramuscular Given 12/23/23 1948)  acetaminophen (TYLENOL) tablet 1,000 mg (1,000 mg Oral Given 12/23/23 1933)  sodium chloride 0.9 % bolus 500 mL (500 mLs Intravenous New Bag/Given 12/23/23 1936)     IMPRESSION / MDM / ASSESSMENT AND PLAN / ED COURSE  I reviewed the triage vital signs and the nursing notes.   Patient's presentation is most consistent with acute presentation with  potential threat to life or bodily function.   Patient comes in with with a fall suspect mechanical given family report frequent falls due to his Lewy body dementia but given the head left patient presented to the emergency room.  CT imaging ordered evaluate for intracranial hemorrhage, cervical fracture.  Basic labs ordered from triage EKG without any evidence of ischemia he denies any chest pain or shortness of breath so this seems less likely.  CBC shows hemoglobin around baseline at 11.3.  CMP shows slight dehydration with low bicarb, elevated creatinine  Discussed with family that I do feel he is dehydrated I will give patient 500 cc of fluid.  Patient is on losartan I recommended they hold it for the next few days until they can follow-up with their primary doctor.   Discussed with patient and the wife about possible admission versus going home.  Given patient's at his baseline self they feel comfortable going home.  They understand that staples need to be removed in 1 day.  Did place a little Surgicel given he has like a skin tear underneath where the staple is to help with a little venous bleeding there.  1. Small right posterior occipital scalp hematoma. No acute  intracranial abnormality. No skull fracture.  2. Stable atrophy and chronic small vessel ischemia.   IMPRESSION: 1. No acute fracture or subluxation of the cervical spine. 2. Mild to moderate diffuse degenerative disc disease and facet hypertrophy.  The patient is on the cardiac monitor to evaluate for evidence of arrhythmia and/or significant heart rate changes.      FINAL CLINICAL IMPRESSION(S) / ED DIAGNOSES   Final diagnoses:  Injury of head, initial encounter  Laceration of scalp, initial encounter     Rx / DC Orders   ED Discharge Orders     None        Note:  This document was prepared using Dragon voice recognition software and may include unintentional dictation errors.   Concha Se,  MD 12/23/23 2109    Concha Se, MD 12/23/23 2117

## 2023-12-23 NOTE — ED Triage Notes (Signed)
 Patient arrived by [facility] from home. Had fall and fell backwards, hit head. Denies LOC. Small lac on back of head.  History dementia and hypertension  EMS vitals: 124/70 b/p 96% RA 80HR 87CBG  20G left hand

## 2023-12-23 NOTE — ED Triage Notes (Signed)
 See first nurse note.

## 2024-03-29 ENCOUNTER — Encounter: Payer: Self-pay | Admitting: Dermatology

## 2024-03-29 ENCOUNTER — Ambulatory Visit: Payer: Medicare Other | Admitting: Dermatology

## 2024-03-29 DIAGNOSIS — L820 Inflamed seborrheic keratosis: Secondary | ICD-10-CM

## 2024-03-29 DIAGNOSIS — Z8589 Personal history of malignant neoplasm of other organs and systems: Secondary | ICD-10-CM

## 2024-03-29 DIAGNOSIS — L570 Actinic keratosis: Secondary | ICD-10-CM | POA: Diagnosis not present

## 2024-03-29 DIAGNOSIS — D1801 Hemangioma of skin and subcutaneous tissue: Secondary | ICD-10-CM

## 2024-03-29 DIAGNOSIS — L578 Other skin changes due to chronic exposure to nonionizing radiation: Secondary | ICD-10-CM | POA: Diagnosis not present

## 2024-03-29 DIAGNOSIS — Z1283 Encounter for screening for malignant neoplasm of skin: Secondary | ICD-10-CM

## 2024-03-29 DIAGNOSIS — L821 Other seborrheic keratosis: Secondary | ICD-10-CM

## 2024-03-29 DIAGNOSIS — L814 Other melanin hyperpigmentation: Secondary | ICD-10-CM

## 2024-03-29 DIAGNOSIS — Z85828 Personal history of other malignant neoplasm of skin: Secondary | ICD-10-CM

## 2024-03-29 DIAGNOSIS — D485 Neoplasm of uncertain behavior of skin: Secondary | ICD-10-CM

## 2024-03-29 DIAGNOSIS — W908XXA Exposure to other nonionizing radiation, initial encounter: Secondary | ICD-10-CM | POA: Diagnosis not present

## 2024-03-29 DIAGNOSIS — Z872 Personal history of diseases of the skin and subcutaneous tissue: Secondary | ICD-10-CM

## 2024-03-29 DIAGNOSIS — D229 Melanocytic nevi, unspecified: Secondary | ICD-10-CM

## 2024-03-29 NOTE — Patient Instructions (Addendum)

## 2024-03-29 NOTE — Progress Notes (Signed)
 Follow-Up Visit   Subjective  Brandon Barrett is a 88 y.o. male who presents for the following: Skin Cancer Screening and Full Body Skin Exam hx of Atypical Fibroxanthoma, hx of BCCs, hx of SCCs, hx of Aks, check spots scalp, chest, one on chest itchy Patient accompanied by son.  The patient presents for Total-Body Skin Exam (TBSE) for skin cancer screening and mole check. The patient has spots, moles and lesions to be evaluated, some may be new or changing and the patient may have concern these could be cancer.  The following portions of the chart were reviewed this encounter and updated as appropriate: medications, allergies, medical history  Review of Systems:  No other skin or systemic complaints except as noted in HPI or Assessment and Plan.  Objective  Well appearing patient in no apparent distress; mood and affect are within normal limits.  A full examination was performed including scalp, head, eyes, ears, nose, lips, neck, chest, axillae, abdomen, back, buttocks, bilateral upper extremities, bilateral lower extremities, hands, feet, fingers, toes, fingernails, and toenails. All findings within normal limits unless otherwise noted below.   Relevant physical exam findings are noted in the Assessment and Plan.  Scalp x 3 (3) Pink scaly macules L medial cheek x 1, L chest and clavicle area x 4 (5) Stuck on waxy paps with erythema  Assessment & Plan   SKIN CANCER SCREENING PERFORMED TODAY.  ACTINIC DAMAGE - Chronic condition, secondary to cumulative UV/sun exposure - diffuse scaly erythematous macules with underlying dyspigmentation - Recommend daily broad spectrum sunscreen SPF 30+ to sun-exposed areas, reapply every 2 hours as needed.  - Staying in the shade or wearing long sleeves, sun glasses (UVA+UVB protection) and wide brim hats (4-inch brim around the entire circumference of the hat) are also recommended for sun protection.  - Call for new or changing  lesions.  LENTIGINES, SEBORRHEIC KERATOSES, HEMANGIOMAS - Benign normal skin lesions - Benign-appearing - Call for any changes  MELANOCYTIC NEVI - Tan-brown and/or pink-flesh-colored symmetric macules and papules - Benign appearing on exam today - Observation - Call clinic for new or changing moles - Recommend daily use of broad spectrum spf 30+ sunscreen to sun-exposed areas.   HISTORY OF BASAL CELL CARCINOMA OF THE SKIN - No evidence of recurrence today - Recommend regular full body skin exams - Recommend daily broad spectrum sunscreen SPF 30+ to sun-exposed areas, reapply every 2 hours as needed.  - Call if any new or changing lesions are noted between office visits  - L inf forehead, L temple zygoma, L forehead, R mid back paraspinal, L temple, L forehead 2.0cm above brow  HISTORY OF SQUAMOUS CELL CARCINOMA OF THE SKIN - No evidence of recurrence today - No lymphadenopathy - Recommend regular full body skin exams - Recommend daily broad spectrum sunscreen SPF 30+ to sun-exposed areas, reapply every 2 hours as needed.  - Call if any new or changing lesions are noted between office visits - R cheek, L parietal scalp, L ear, L of mid line ant scalp, L of mid line ant scalp  HISTORY of ATYPICAL FIBROXANTHOMA R scalp, mohs with Dr. Debrah Fan 04/01/2023 Exam: R scalp clear with visual exam and palpation, no lymphadenopathy No Lymphadenopathy today. Treatment Plan: Keep f/u with Dr. Lorie Rook. Observe for recurrence.  No Lymphadenopathy. Call clinic for new or changing lesions.  Recommend regular skin exams, daily broad-spectrum spf 30+ sunscreen use, and photoprotection.      AK (ACTINIC KERATOSIS) (3) Scalp x  3 (3) Actinic keratoses are precancerous spots that appear secondary to cumulative UV radiation exposure/sun exposure over time. They are chronic with expected duration over 1 year. A portion of actinic keratoses will progress to squamous cell carcinoma of the skin. It is not  possible to reliably predict which spots will progress to skin cancer and so treatment is recommended to prevent development of skin cancer.  Recommend daily broad spectrum sunscreen SPF 30+ to sun-exposed areas, reapply every 2 hours as needed.  Recommend staying in the shade or wearing long sleeves, sun glasses (UVA+UVB protection) and wide brim hats (4-inch brim around the entire circumference of the hat). Call for new or changing lesions. Destruction of lesion - Scalp x 3 (3) Complexity: simple   Destruction method: cryotherapy   Informed consent: discussed and consent obtained   Timeout:  patient name, date of birth, surgical site, and procedure verified Lesion destroyed using liquid nitrogen: Yes   Region frozen until ice ball extended beyond lesion: Yes   Outcome: patient tolerated procedure well with no complications   Post-procedure details: wound care instructions given   INFLAMED SEBORRHEIC KERATOSIS (5) L medial cheek x 1, L chest and clavicle area x 4 (5) Symptomatic, irritating, patient would like treated. Destruction of lesion - L medial cheek x 1, L chest and clavicle area x 4 (5) Complexity: simple   Destruction method: cryotherapy   Informed consent: discussed and consent obtained   Timeout:  patient name, date of birth, surgical site, and procedure verified Lesion destroyed using liquid nitrogen: Yes   Region frozen until ice ball extended beyond lesion: Yes   Outcome: patient tolerated procedure well with no complications   Post-procedure details: wound care instructions given    Return in about 1 year (around 03/29/2025) for TBSE hx of AFX, Hx of BCC, Hx of SCC, Hx of AKs.  I, Rollie Clipper, RMA, am acting as scribe for Celine Collard, MD .   Documentation: I have reviewed the above documentation for accuracy and completeness, and I agree with the above.  Celine Collard, MD

## 2024-06-14 ENCOUNTER — Other Ambulatory Visit: Payer: Self-pay

## 2024-06-14 ENCOUNTER — Emergency Department
Admission: EM | Admit: 2024-06-14 | Discharge: 2024-06-14 | Disposition: A | Discharge status: Home / Self Care | Attending: Emergency Medicine | Admitting: Emergency Medicine

## 2024-06-14 ENCOUNTER — Emergency Department

## 2024-06-14 DIAGNOSIS — I10 Essential (primary) hypertension: Secondary | ICD-10-CM | POA: Insufficient documentation

## 2024-06-14 DIAGNOSIS — R4781 Slurred speech: Secondary | ICD-10-CM | POA: Diagnosis present

## 2024-06-14 DIAGNOSIS — E119 Type 2 diabetes mellitus without complications: Secondary | ICD-10-CM | POA: Diagnosis not present

## 2024-06-14 DIAGNOSIS — R29898 Other symptoms and signs involving the musculoskeletal system: Secondary | ICD-10-CM

## 2024-06-14 DIAGNOSIS — F0390 Unspecified dementia without behavioral disturbance: Secondary | ICD-10-CM | POA: Diagnosis not present

## 2024-06-14 DIAGNOSIS — G459 Transient cerebral ischemic attack, unspecified: Secondary | ICD-10-CM | POA: Diagnosis not present

## 2024-06-14 HISTORY — DX: Parkinson's disease without dyskinesia, without mention of fluctuations: G20.A1

## 2024-06-14 HISTORY — DX: Type 2 diabetes mellitus without complications: E11.9

## 2024-06-14 LAB — COMPREHENSIVE METABOLIC PANEL WITH GFR
ALT: 11 U/L (ref 0–44)
AST: 25 U/L (ref 15–41)
Albumin: 3.7 g/dL (ref 3.5–5.0)
Alkaline Phosphatase: 62 U/L (ref 38–126)
Anion gap: 12 (ref 5–15)
BUN: 16 mg/dL (ref 8–23)
CO2: 20 mmol/L — ABNORMAL LOW (ref 22–32)
Calcium: 9.2 mg/dL (ref 8.9–10.3)
Chloride: 106 mmol/L (ref 98–111)
Creatinine, Ser: 1.36 mg/dL — ABNORMAL HIGH (ref 0.61–1.24)
GFR, Estimated: 51 mL/min — ABNORMAL LOW (ref >60)
Glucose, Bld: 141 mg/dL — ABNORMAL HIGH (ref 70–99)
Potassium: 4.3 mmol/L (ref 3.5–5.1)
Sodium: 138 mmol/L (ref 135–145)
Total Bilirubin: 0.7 mg/dL (ref 0.0–1.2)
Total Protein: 7.1 g/dL (ref 6.5–8.1)

## 2024-06-14 LAB — URINALYSIS, ROUTINE W REFLEX MICROSCOPIC
Bilirubin Urine: NEGATIVE
Glucose, UA: NEGATIVE mg/dL
Hgb urine dipstick: NEGATIVE
Ketones, ur: NEGATIVE mg/dL
Leukocytes,Ua: NEGATIVE
Nitrite: NEGATIVE
Protein, ur: NEGATIVE mg/dL
Specific Gravity, Urine: 1.009 (ref 1.005–1.030)
pH: 5 (ref 5.0–8.0)

## 2024-06-14 LAB — CBC
HCT: 35.7 % — ABNORMAL LOW (ref 39.0–52.0)
Hemoglobin: 11.9 g/dL — ABNORMAL LOW (ref 13.0–17.0)
MCH: 31.7 pg (ref 26.0–34.0)
MCHC: 33.3 g/dL (ref 30.0–36.0)
MCV: 95.2 fL (ref 80.0–100.0)
Platelets: 197 K/uL (ref 150–400)
RBC: 3.75 MIL/uL — ABNORMAL LOW (ref 4.22–5.81)
RDW: 13.2 % (ref 11.5–15.5)
WBC: 6.4 K/uL (ref 4.0–10.5)
nRBC: 0 % (ref 0.0–0.2)

## 2024-06-14 NOTE — Discharge Instructions (Addendum)
 You are seen in the ER today for evaluation of your episode of slurred speech and leg weakness.  Fortunately your exam here is reassuring.  The testing today did not show an emergency cause for your symptoms, but as we discussed we have not fully ruled out a stroke.  I suspect you likely have something called a TIA.  Follow-up with your primary care doctor for further evaluation.  Return to the ER for new or worsening symptoms.

## 2024-06-14 NOTE — ED Provider Notes (Signed)
 [facility] Provider Note    Event Date/Time   First MD Initiated Contact with Patient 06/14/24 2027     (approximate)   History   Extremity Weakness   HPI  Brandon Barrett is a 88 year old male with history of hypertension, T2DM, Lewy body dementia, Parkinson's disease presenting to the emergency department for evaluation of strokelike symptoms.  Around 1310 today patient had an episode of slurred speech, facial droop, and right leg weakness that lasted for about 15 minutes.  On presentation to the ER, symptoms had resolved.  On my evaluation, patient continues to deny any new symptoms.  Patient and family both deny similar episodes in the past.     Physical Exam   Triage Vital Signs: ED Triage Vitals  Encounter Vitals Group     BP 06/14/24 1407 119/76     Girls Systolic BP Percentile --      Girls Diastolic BP Percentile --      Boys Systolic BP Percentile --      Boys Diastolic BP Percentile --      Pulse Rate 06/14/24 1407 80     Resp 06/14/24 1407 18     Temp 06/14/24 1407 98.6 F (37 C)     Temp Source 06/14/24 1407 Oral     SpO2 06/14/24 1407 94 %     Weight --      Height --      Head Circumference --      Peak Flow --      Pain Score 06/14/24 1403 0     Pain Loc --      Pain Education --      Exclude from Growth Chart --     Most recent vital signs: Vitals:   06/14/24 2130 06/14/24 2200  BP: (!) 169/80 (!) 154/88  Pulse: 88 89  Resp: 17 18  Temp:    SpO2: 96% 95%     General: Awake, interactive  CV:  Regular rate, good peripheral perfusion.  Resp:  Unlabored respirations.  Abd:  Nondistended.  Neuro:  Keenly aware, correctly answers month and age, able to blink eyes and squeeze hands, normal horizontal extraocular movements, no visual field loss, normal facial symmetry, no arm or leg motor drift, no limb ataxia, normal sensation, no aphasia, no dysarthria, no inattention. NIH 0    ED Results / Procedures /  Treatments   Labs (all labs ordered are listed, but only abnormal results are displayed) Labs Reviewed  COMPREHENSIVE METABOLIC PANEL WITH GFR - Abnormal; Notable for the following components:      Result Value   CO2 20 (*)    Glucose, Bld 141 (*)    Creatinine, Ser 1.36 (*)    GFR, Estimated 51 (*)    All other components within normal limits  CBC - Abnormal; Notable for the following components:   RBC 3.75 (*)    Hemoglobin 11.9 (*)    HCT 35.7 (*)    All other components within normal limits  URINALYSIS, ROUTINE W REFLEX MICROSCOPIC - Abnormal; Notable for the following components:   Color, Urine YELLOW (*)    APPearance CLEAR (*)    All other components within normal limits     EKG EKG independently reviewed and interpreted by myself demonstrates:  EKG demonstrates normal sinus rhythm rate of 81, PR 180, QRS 98, QTc 436, no acute ST changes  RADIOLOGY Imaging independently reviewed and interpreted by myself demonstrates:  CT head without acute  bleed  Formal Radiology Read:  CT HEAD WO CONTRAST ( ) Result Date: 06/14/2024 CLINICAL DATA:  Provided history: Weakness. Additional history provided: Transient slurred speech, facial droop, right leg weakness. EXAM: CT HEAD WITHOUT CONTRAST TECHNIQUE: Contiguous axial images were obtained from the base of the skull through the vertex without intravenous contrast. RADIATION DOSE REDUCTION: This exam was performed according to the departmental dose-optimization program which includes automated exposure control, adjustment of the mA and/or kV according to patient size and/or use of iterative reconstruction technique. COMPARISON:  Head CT 12/23/2023. FINDINGS: Brain: Mild cerebral atrophy. There is no acute intracranial hemorrhage. No demarcated cortical infarct. No extra-axial fluid collection. No evidence of an intracranial mass. No midline shift. Vascular: No hyperdense vessel. Atherosclerotic calcifications. Skull: No calvarial fracture  or aggressive osseous lesion. Sinuses/Orbits: No mass or acute finding within the imaged orbits. No significant paranasal sinus disease at the imaged levels. IMPRESSION: 1.  No evidence of an acute intracranial abnormality. 2. Mild cerebral atrophy. Electronically Signed   By: Rockey Childs D.O.   On: 06/14/2024 15:32    PROCEDURES:  Critical Care performed: No  Procedures   MEDICATIONS ORDERED IN ED: Medications - No data to display   IMPRESSION / MDM / ASSESSMENT AND PLAN / ED COURSE  I reviewed the triage vital signs and the nursing notes.  Differential diagnosis includes, but is not limited to, TIA, CVA, electrolyte abnormality, anemia, drug intoxication or withdrawal  Patient's presentation is most consistent with acute presentation with potential threat to life or bodily function.  88 year old male presenting to the emergency department for evaluation of resolved episode of slurred speech and right leg weakness.  Stable vitals on presentation.  Labs with CBC with  stable anemia.  CMP with stable mild renal impairment.  UA without evidence of infection.  CT head without acute bleed.  Clinical presentation seems most consistent with TIA.  Patient does have multiple risk factors and I did consider and discussed admission with both patient and family.  They note concerns regarding patient's labile mental status in the setting of his Lewy body dementia that may worsen in the hospital.  They strongly prefer to be discharged home and follow-up as an outpatient.  They do demonstrate decision-making capacity.  They do understand that his testing today does not rule out a stroke.  With this, do think discharge with strict return precautions is reasonable.  Patient reports he was previously on aspirin but taken off of this by his cardiologist, so we will discuss this further with his outpatient doctors.  Strict return precautions provided.  Patient discharged in stable condition.     FINAL  CLINICAL IMPRESSION(S) / ED DIAGNOSES   Final diagnoses:  Slurred speech  Weakness of right lower extremity  TIA (transient ischemic attack)     Rx / DC Orders   ED Discharge Orders     None        Note:  This document was prepared using Dragon voice recognition software and may include unintentional dictation errors.   Levander Slate, MD 06/14/24 2232

## 2024-06-14 NOTE — ED Triage Notes (Signed)
 Patient's daughter states patient had slurred speech, facial droop and right leg weakness that started around 1310 and lasted approximately 10 minutes; patient back to baseline at time of triage.

## 2024-06-19 ENCOUNTER — Other Ambulatory Visit: Payer: Self-pay | Admitting: Internal Medicine

## 2024-06-19 DIAGNOSIS — R531 Weakness: Secondary | ICD-10-CM

## 2024-06-19 DIAGNOSIS — H9190 Unspecified hearing loss, unspecified ear: Secondary | ICD-10-CM

## 2024-06-19 DIAGNOSIS — G459 Transient cerebral ischemic attack, unspecified: Secondary | ICD-10-CM

## 2024-06-27 ENCOUNTER — Ambulatory Visit
Admission: RE | Admit: 2024-06-27 | Discharge: 2024-06-27 | Disposition: A | Discharge status: Home / Self Care | Source: Ambulatory Visit | Attending: Internal Medicine | Admitting: Internal Medicine

## 2024-06-27 DIAGNOSIS — R531 Weakness: Secondary | ICD-10-CM | POA: Diagnosis present

## 2024-06-27 DIAGNOSIS — H9190 Unspecified hearing loss, unspecified ear: Secondary | ICD-10-CM | POA: Diagnosis present

## 2024-06-27 DIAGNOSIS — G459 Transient cerebral ischemic attack, unspecified: Secondary | ICD-10-CM | POA: Insufficient documentation

## 2024-06-27 MED ORDER — GADOBUTROL 1 MMOL/ML IV SOLN
7.5000 mL | Freq: Once | INTRAVENOUS | Status: AC | PRN
  Administered 2024-06-27: 7.5 mL via INTRAVENOUS

## 2024-09-27 ENCOUNTER — Ambulatory Visit: Admitting: Dermatology

## 2024-09-27 ENCOUNTER — Encounter: Payer: Self-pay | Admitting: Dermatology

## 2024-09-27 DIAGNOSIS — Z85828 Personal history of other malignant neoplasm of skin: Secondary | ICD-10-CM | POA: Diagnosis not present

## 2024-09-27 DIAGNOSIS — Z8589 Personal history of malignant neoplasm of other organs and systems: Secondary | ICD-10-CM

## 2024-09-27 DIAGNOSIS — L821 Other seborrheic keratosis: Secondary | ICD-10-CM | POA: Diagnosis not present

## 2024-09-27 DIAGNOSIS — W908XXA Exposure to other nonionizing radiation, initial encounter: Secondary | ICD-10-CM

## 2024-09-27 DIAGNOSIS — L570 Actinic keratosis: Secondary | ICD-10-CM

## 2024-09-27 DIAGNOSIS — L820 Inflamed seborrheic keratosis: Secondary | ICD-10-CM

## 2024-09-27 DIAGNOSIS — L578 Other skin changes due to chronic exposure to nonionizing radiation: Secondary | ICD-10-CM | POA: Diagnosis not present

## 2024-09-27 NOTE — Patient Instructions (Addendum)
 Actinic keratoses are precancerous spots that appear secondary to cumulative UV radiation exposure/sun exposure over time. They are chronic with expected duration over 1 year. A portion of actinic keratoses will progress to squamous cell carcinoma of the skin. It is not possible to reliably predict which spots will progress to skin cancer and so treatment is recommended to prevent development of skin cancer.  Recommend daily broad spectrum sunscreen SPF 30+ to sun-exposed areas, reapply every 2 hours as needed.  Recommend staying in the shade or wearing long sleeves, sun glasses (UVA+UVB protection) and wide brim hats (4-inch brim around the entire circumference of the hat). Call for new or changing lesions.   Cryotherapy Aftercare  Wash gently with soap and water everyday.   Apply Vaseline and Band-Aid daily until healed.    Seborrheic Keratosis  What causes seborrheic keratoses? Seborrheic keratoses are harmless, common skin growths that first appear during adult life.  As time goes by, more growths appear.  Some people may develop a large number of them.  Seborrheic keratoses appear on both covered and uncovered body parts.  They are not caused by sunlight.  The tendency to develop seborrheic keratoses can be inherited.  They vary in color from skin-colored to gray, brown, or even black.  They can be either smooth or have a rough, warty surface.   Seborrheic keratoses are superficial and look as if they were stuck on the skin.  Under the microscope this type of keratosis looks like layers upon layers of skin.  That is why at times the top layer may seem to fall off, but the rest of the growth remains and re-grows.    Treatment Seborrheic keratoses do not need to be treated, but can easily be removed in the office.  Seborrheic keratoses often cause symptoms when they rub on clothing or jewelry.  Lesions can be in the way of shaving.  If they become inflamed, they can cause itching, soreness,  or burning.  Removal of a seborrheic keratosis can be accomplished by freezing, burning, or surgery. If any spot bleeds, scabs, or grows rapidly, please return to have it checked, as these can be an indication of a skin cancer.  Due to recent changes in healthcare laws, you may see results of your pathology and/or laboratory studies on MyChart before the doctors have had a chance to review them. We understand that in some cases there may be results that are confusing or concerning to you. Please understand that not all results are received at the same time and often the doctors may need to interpret multiple results in order to provide you with the best plan of care or course of treatment. Therefore, we ask that you please give us  2 business days to thoroughly review all your results before contacting the office for clarification. Should we see a critical lab result, you will be contacted sooner.   If You Need Anything After Your Visit  If you have any questions or concerns for your doctor, please call our main line at [phone] and press option 4 to reach your doctor's medical assistant. If no one answers, please leave a voicemail as directed and we will return your call as soon as possible. Messages left after 4 pm will be answered the following business day.   You may also send us  a message via MyChart. We typically respond to MyChart messages within 1-2 business days.  For prescription refills, please ask your pharmacy to contact our office. Our fax number  is [phone].  If you have an urgent issue when the clinic is closed that cannot wait until the next business day, you can page your doctor at the number below.    Please note that while we do our best to be available for urgent issues outside of office hours, we are not available 24/7.   If you have an urgent issue and are unable to reach us , you may choose to seek medical care at your doctor's office, retail clinic, urgent care  center, or emergency room.  If you have a medical emergency, please immediately call 911 or go to the emergency department.  Pager Numbers  - Dr. Hester: [phone]  - Dr. Jackquline: [phone]  - Dr. Claudene: [phone]   - Dr. Raymund: [phone]  In the event of inclement weather, please call our main line at [phone] for an update on the status of any delays or closures.  Dermatology Medication Tips: Please keep the boxes that topical medications come in in order to help keep track of the instructions about where and how to use these. Pharmacies typically print the medication instructions only on the boxes and not directly on the medication tubes.   If your medication is too expensive, please contact our office at [phone] option 4 or send us  a message through MyChart.   We are unable to tell what your co-pay for medications will be in advance as this is different depending on your insurance coverage. However, we may be able to find a substitute medication at lower cost or fill out paperwork to get insurance to cover a needed medication.   If a prior authorization is required to get your medication covered by your insurance company, please allow us  1-2 business days to complete this process.  Drug prices often vary depending on where the prescription is filled and some pharmacies may offer cheaper prices.  The website www.goodrx.com contains coupons for medications through different pharmacies. The prices here do not account for what the cost may be with help from insurance (it may be cheaper with your insurance), but the website can give you the price if you did not use any insurance.  - You can print the associated coupon and take it with your prescription to the pharmacy.  - You may also stop by our office during regular business hours and pick up a GoodRx coupon card.  - If you need your prescription sent electronically to a different pharmacy, notify our office  through [facility] or by phone at [phone] option 4.     Si Usted Necesita Algo Despus de Su Visita  Tambin puede enviarnos un mensaje a travs de Clinical cytogeneticist. Por lo general respondemos a los mensajes de MyChart en el transcurso de 1 a 2 das hbiles.  Para renovar recetas, por favor pida a su farmacia que se ponga en contacto con nuestra oficina. Randi lakes de fax es [place] [phone].  Si tiene un asunto urgente cuando la clnica est cerrada y que no puede esperar hasta el siguiente da hbil, puede llamar/localizar a su doctor(a) al nmero que aparece a continuacin.   Por favor, tenga en cuenta que aunque hacemos todo lo posible para estar disponibles para asuntos urgentes fuera del horario de [place], no estamos disponibles las 24 horas del da, los 7 [address]  [address] de la [place].   Si tiene un problema urgente y no puede comunicarse con nosotros, puede optar por buscar atencin mdica  en el consultorio de su doctor(a), en una clnica  privada, en un centro de atencin urgente o en una sala de emergencias.  Si tiene Engineer, drilling, por favor llame inmediatamente al 911 o vaya a la sala de emergencias.  Nmeros de bper  - Dr. Hester: [phone]  - Dra. Jackquline: [phone]  - Dr. Claudene: [phone]  - Dra. Kitts: [phone]  En caso de inclemencias del [place], por favor llame a nuestra lnea principal al [phone] para una actualizacin sobre el estado de cualquier retraso o cierre.  Consejos para la medicacin en dermatologa: Por favor, guarde las cajas en las que vienen los medicamentos de uso tpico para ayudarle a seguir las instrucciones sobre dnde y cmo usarlos. Las farmacias generalmente imprimen las instrucciones del medicamento slo en las cajas y no directamente en los tubos del [place].   Si su medicamento es muy caro, por favor, pngase en contacto con landry rieger llamando al [phone] y presione la opcin 4 o envenos un mensaje  a travs de Clinical cytogeneticist.   No podemos decirle cul ser su copago por los medicamentos por adelantado ya que esto es diferente dependiendo de la cobertura de su seguro. Sin embargo, es posible que podamos encontrar un medicamento sustituto a Audiological scientist un formulario para que el seguro cubra el medicamento que se considera necesario.   Si se requiere una autorizacin previa para que su compaa de seguros malta su medicamento, por favor permtanos de 1 a 2 das hbiles para completar este proceso.  Los precios de los medicamentos varan con frecuencia dependiendo del Environmental consultant de dnde se surte la receta y alguna farmacias pueden ofrecer precios ms baratos.  El sitio web www.goodrx.com tiene cupones para medicamentos de Health and safety inspector. Los precios aqu no tienen en cuenta lo que podra costar con la ayuda del seguro (puede ser ms barato con su seguro), pero el sitio web puede darle el precio si no utiliz Tourist information centre manager.  - Puede imprimir el cupn correspondiente y llevarlo con su receta a la farmacia.  - Tambin puede pasar por nuestra oficina durante el horario de atencin regular y Education officer, museum una tarjeta de cupones de GoodRx.  - Si necesita que su receta se enve electrnicamente a una farmacia diferente, informe a nuestra oficina a travs de MyChart de [place] o por telfono llamando al [phone] y presione la opcin 4.

## 2024-09-27 NOTE — Progress Notes (Signed)
 Follow-Up Visit   Subjective  Brandon Barrett is a 88 y.o. male who presents for the following: here with wife today, concerned with places on b/l ears and right cheek   The following portions of the chart were reviewed this encounter and updated as appropriate: medications, allergies, medical history  Review of Systems:  No other skin or systemic complaints except as noted in HPI or Assessment and Plan.  Objective  Well appearing patient in no apparent distress; mood and affect are within normal limits.   A focused examination was performed of the following areas: Scalp, face, b/l ears , b/l arms and hands   Relevant exam findings are noted in the Assessment and Plan.  scalp x 4 (4) Erythematous thin papules/macules with gritty scale.  right inferior cheek x 1 Erythematous thin papules/macules with gritty scale.  See photos  right and left ear x 2 (2) Erythematous stuck-on, waxy papule or plaque See photos    Assessment & Plan    SEBORRHEIC KERATOSIS - Stuck-on, waxy, tan-brown papules and/or plaques  - Benign-appearing - Discussed benign etiology and prognosis. - Observe - Call for any changes  ACTINIC DAMAGE - chronic, secondary to cumulative UV radiation exposure/sun exposure over time - diffuse scaly erythematous macules with underlying dyspigmentation - Recommend daily broad spectrum sunscreen SPF 30+ to sun-exposed areas, reapply every 2 hours as needed.  - Recommend staying in the shade or wearing long sleeves, sun glasses (UVA+UVB protection) and wide brim hats (4-inch brim around the entire circumference of the hat). - Call for new or changing lesions.  ACTINIC KERATOSIS (5) right inferior cheek x 1, scalp x 4 (4) Will recheck right inferior cheek at next follow up  Photos and treated with cryotherapy today  Actinic keratoses are precancerous spots that appear secondary to cumulative UV radiation exposure/sun exposure over time. They are chronic  with expected duration over 1 year. A portion of actinic keratoses will progress to squamous cell carcinoma of the skin. It is not possible to reliably predict which spots will progress to skin cancer and so treatment is recommended to prevent development of skin cancer.  Recommend daily broad spectrum sunscreen SPF 30+ to sun-exposed areas, reapply every 2 hours as needed.  Recommend staying in the shade or wearing long sleeves, sun glasses (UVA+UVB protection) and wide brim hats (4-inch brim around the entire circumference of the hat). Call for new or changing lesions. - Destruction of lesion - right inferior cheek x 1, scalp x 4 (4) Complexity: simple   Destruction method: cryotherapy   Informed consent: discussed and consent obtained   Timeout:  patient name, date of birth, surgical site, and procedure verified Lesion destroyed using liquid nitrogen: Yes   Region frozen until ice ball extended beyond lesion: Yes   Outcome: patient tolerated procedure well with no complications   Post-procedure details: wound care instructions given    INFLAMED SEBORRHEIC KERATOSIS (2) right and left ear x 2 (2) Symptomatic, irritating, patient would like treated.  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  - Call if any new or changing lesions are noted between office visits  Return for April 2026 tbse and recheck aks and isk .   IEleanor Blush, CMA, am acting as scribe for Alm Rhyme, MD.  Documentation: I have reviewed the above documentation for accuracy and completeness, and I agree with the above.  Alm Rhyme, MD

## 2025-01-02 ENCOUNTER — Ambulatory Visit: Admitting: Dermatology

## 2025-02-06 ENCOUNTER — Ambulatory Visit: Admitting: Dermatology

## 2025-03-29 ENCOUNTER — Ambulatory Visit: Admitting: Dermatology
